# Patient Record
Sex: Male | Born: 1955 | Race: White | Hispanic: No | Marital: Single | Smoking: Current some day smoker
Health system: Southern US, Community
[De-identification: ages and names within clinical notes are randomized; demographics above are authoritative.]

## PROBLEM LIST (undated history)

## (undated) DIAGNOSIS — G4733 Obstructive sleep apnea (adult) (pediatric): Secondary | ICD-10-CM

## (undated) DIAGNOSIS — F259 Schizoaffective disorder, unspecified: Secondary | ICD-10-CM

## (undated) DIAGNOSIS — R451 Restlessness and agitation: Secondary | ICD-10-CM

## (undated) DIAGNOSIS — R079 Chest pain, unspecified: Secondary | ICD-10-CM

## (undated) DIAGNOSIS — I495 Sick sinus syndrome: Secondary | ICD-10-CM

## (undated) DIAGNOSIS — I200 Unstable angina: Secondary | ICD-10-CM

## (undated) DIAGNOSIS — Q600 Renal agenesis, unilateral: Secondary | ICD-10-CM

## (undated) DIAGNOSIS — Z45018 Encounter for adjustment and management of other part of cardiac pacemaker: Secondary | ICD-10-CM

## (undated) DIAGNOSIS — R0602 Shortness of breath: Secondary | ICD-10-CM

## (undated) DIAGNOSIS — I491 Atrial premature depolarization: Secondary | ICD-10-CM

## (undated) DIAGNOSIS — E039 Hypothyroidism, unspecified: Secondary | ICD-10-CM

## (undated) DIAGNOSIS — J45909 Unspecified asthma, uncomplicated: Secondary | ICD-10-CM

## (undated) DIAGNOSIS — J449 Chronic obstructive pulmonary disease, unspecified: Secondary | ICD-10-CM

## (undated) DIAGNOSIS — I209 Angina pectoris, unspecified: Secondary | ICD-10-CM

## (undated) DIAGNOSIS — E139 Other specified diabetes mellitus without complications: Secondary | ICD-10-CM

## (undated) DIAGNOSIS — I10 Essential (primary) hypertension: Secondary | ICD-10-CM

## (undated) DIAGNOSIS — E7800 Pure hypercholesterolemia, unspecified: Secondary | ICD-10-CM

## (undated) DIAGNOSIS — I2510 Atherosclerotic heart disease of native coronary artery without angina pectoris: Secondary | ICD-10-CM

## (undated) HISTORY — DX: Atrial premature depolarization: I49.1

## (undated) HISTORY — DX: Atherosclerotic heart disease of native coronary artery without angina pectoris: I25.10

## (undated) HISTORY — DX: Shortness of breath: R06.02

## (undated) HISTORY — DX: Sick sinus syndrome: I49.5

## (undated) HISTORY — DX: Unstable angina: I20.0

## (undated) HISTORY — DX: Encounter for adjustment and management of other part of cardiac pacemaker: Z45.018

## (undated) HISTORY — PX: CARDIAC PACEMAKER PLACEMENT: SHX583

## (undated) HISTORY — DX: Pure hypercholesterolemia, unspecified: E78.00

## (undated) HISTORY — PX: NEPHRECTOMY: SHX65

## (undated) HISTORY — DX: Essential (primary) hypertension: I10

## (undated) HISTORY — DX: Chest pain, unspecified: R07.9

## (undated) HISTORY — DX: Unspecified asthma, uncomplicated: J45.909

## (undated) HISTORY — DX: Other specified diabetes mellitus without complications: E13.9

## (undated) HISTORY — DX: Schizoaffective disorder, unspecified: F25.9

## (undated) HISTORY — DX: Restlessness and agitation: R45.1

## (undated) HISTORY — DX: Angina pectoris, unspecified: I20.9

## (undated) HISTORY — DX: Obstructive sleep apnea (adult) (pediatric): G47.33

## (undated) HISTORY — DX: Renal agenesis, unilateral: Q60.0

## (undated) HISTORY — DX: Hypothyroidism, unspecified: E03.9

## (undated) HISTORY — DX: Chronic obstructive pulmonary disease, unspecified: J44.9

---

## 2011-04-14 ENCOUNTER — Inpatient Hospital Stay: Payer: Self-pay | Admitting: Internal Medicine

## 2011-04-14 LAB — CBC WITH DIFFERENTIAL/PLATELET
Basophil #: 0 10*3/uL (ref 0.0–0.1)
Basophil %: 0.3 %
Eosinophil #: 0 10*3/uL (ref 0.0–0.7)
Eosinophil %: 1 %
HCT: 35.7 % — ABNORMAL LOW (ref 40.0–52.0)
HGB: 12.2 g/dL — ABNORMAL LOW (ref 13.0–18.0)
Lymphocyte #: 1 10*3/uL (ref 1.0–3.6)
MCH: 32.5 pg (ref 26.0–34.0)
MCV: 95 fL (ref 80–100)
Monocyte #: 0.4 10*3/uL (ref 0.0–0.7)
Neutrophil #: 2.3 10*3/uL (ref 1.4–6.5)
RBC: 3.74 10*6/uL — ABNORMAL LOW (ref 4.40–5.90)
RDW: 13.9 % (ref 11.5–14.5)
WBC: 3.8 10*3/uL (ref 3.8–10.6)

## 2011-04-14 LAB — CK TOTAL AND CKMB (NOT AT ARMC)
CK, Total: 167 U/L (ref 35–232)
CK-MB: 2.8 ng/mL (ref 0.5–3.6)
CK-MB: 2.6 ng/mL (ref 0.5–3.6)

## 2011-04-14 LAB — COMPREHENSIVE METABOLIC PANEL
Albumin: 3.7 g/dL (ref 3.4–5.0)
Alkaline Phosphatase: 56 U/L (ref 50–136)
Anion Gap: 12 (ref 7–16)
BUN: 19 mg/dL — ABNORMAL HIGH (ref 7–18)
Bilirubin,Total: 0.4 mg/dL (ref 0.2–1.0)
Calcium, Total: 8.5 mg/dL (ref 8.5–10.1)
Creatinine: 1.5 mg/dL — ABNORMAL HIGH (ref 0.60–1.30)
Glucose: 96 mg/dL (ref 65–99)
Osmolality: 289 (ref 275–301)
SGPT (ALT): 30 U/L
Sodium: 144 mmol/L (ref 136–145)
Total Protein: 7.4 g/dL (ref 6.4–8.2)

## 2011-04-14 LAB — PROTIME-INR
INR: 1
Prothrombin Time: 13.7 secs (ref 11.5–14.7)

## 2011-04-14 LAB — HEMOGLOBIN A1C: Hemoglobin A1C: 6 % (ref 4.2–6.3)

## 2011-04-14 LAB — APTT: Activated PTT: 64 secs — ABNORMAL HIGH (ref 23.6–35.9)

## 2011-04-14 LAB — TROPONIN I: Troponin-I: 0.08 ng/mL — ABNORMAL HIGH

## 2011-04-15 LAB — BASIC METABOLIC PANEL
BUN: 18 mg/dL (ref 7–18)
Chloride: 109 mmol/L — ABNORMAL HIGH (ref 98–107)
Creatinine: 1.61 mg/dL — ABNORMAL HIGH (ref 0.60–1.30)
EGFR (African American): 58 — ABNORMAL LOW
EGFR (Non-African Amer.): 48 — ABNORMAL LOW
Glucose: 134 mg/dL — ABNORMAL HIGH (ref 65–99)
Osmolality: 298 (ref 275–301)
Potassium: 3.8 mmol/L (ref 3.5–5.1)

## 2011-04-15 LAB — CBC WITH DIFFERENTIAL/PLATELET
Eosinophil %: 1.5 %
HCT: 34.3 % — ABNORMAL LOW (ref 40.0–52.0)
Lymphocyte #: 1.3 10*3/uL (ref 1.0–3.6)
MCHC: 34.2 g/dL (ref 32.0–36.0)
MCV: 95 fL (ref 80–100)
Monocyte %: 10.4 %
Neutrophil #: 2.2 10*3/uL (ref 1.4–6.5)
Platelet: 105 10*3/uL — ABNORMAL LOW (ref 150–440)
RDW: 13.7 % (ref 11.5–14.5)
WBC: 3.9 10*3/uL (ref 3.8–10.6)

## 2011-04-15 LAB — APTT: Activated PTT: 106.3 s — ABNORMAL HIGH

## 2011-04-15 LAB — LIPID PANEL
Cholesterol: 137 mg/dL
HDL Cholesterol: 20 mg/dL — ABNORMAL LOW
Ldl Cholesterol, Calc: 75 mg/dL
Triglycerides: 208 mg/dL — ABNORMAL HIGH
VLDL Cholesterol, Calc: 42 mg/dL — ABNORMAL HIGH

## 2011-04-16 LAB — BASIC METABOLIC PANEL
BUN: 16 mg/dL (ref 7–18)
Calcium, Total: 8.8 mg/dL (ref 8.5–10.1)
Creatinine: 1.53 mg/dL — ABNORMAL HIGH (ref 0.60–1.30)
EGFR (African American): >60
EGFR (Non-African Amer.): 51 — ABNORMAL LOW
Potassium: 3.5 mmol/L (ref 3.5–5.1)
Sodium: 146 mmol/L — ABNORMAL HIGH (ref 136–145)

## 2011-04-16 LAB — CBC WITH DIFFERENTIAL/PLATELET
Basophil #: 0 10*3/uL (ref 0.0–0.1)
Basophil %: 0.2 %
Eosinophil %: 1.5 %
HCT: 40.7 % (ref 40.0–52.0)
HGB: 13.6 g/dL (ref 13.0–18.0)
Lymphocyte #: 0.8 10*3/uL — ABNORMAL LOW (ref 1.0–3.6)
Lymphocyte %: 14.2 %
MCH: 31.6 pg (ref 26.0–34.0)
MCV: 95 fL (ref 80–100)
RDW: 13.2 % (ref 11.5–14.5)

## 2011-06-10 ENCOUNTER — Observation Stay: Payer: Self-pay | Admitting: Internal Medicine

## 2011-06-10 LAB — COMPREHENSIVE METABOLIC PANEL
Albumin: 3.3 g/dL — ABNORMAL LOW (ref 3.4–5.0)
Anion Gap: 11 (ref 7–16)
BUN: 15 mg/dL (ref 7–18)
Bilirubin,Total: 0.4 mg/dL (ref 0.2–1.0)
Calcium, Total: 8.1 mg/dL — ABNORMAL LOW (ref 8.5–10.1)
Co2: 25 mmol/L (ref 21–32)
Creatinine: 1.56 mg/dL — ABNORMAL HIGH (ref 0.60–1.30)
EGFR (African American): 57 — ABNORMAL LOW
EGFR (Non-African Amer.): 49 — ABNORMAL LOW
Glucose: 108 mg/dL — ABNORMAL HIGH (ref 65–99)
Potassium: 3.3 mmol/L — ABNORMAL LOW (ref 3.5–5.1)
SGOT(AST): 17 U/L (ref 15–37)
SGPT (ALT): 25 U/L
Sodium: 144 mmol/L (ref 136–145)
Total Protein: 6.7 g/dL (ref 6.4–8.2)

## 2011-06-10 LAB — PROTIME-INR: INR: 1

## 2011-06-10 LAB — CBC
HGB: 11.3 g/dL — ABNORMAL LOW (ref 13.0–18.0)
MCH: 31.9 pg (ref 26.0–34.0)
MCV: 94 fL (ref 80–100)
Platelet: 129 10*3/uL — ABNORMAL LOW (ref 150–440)
RDW: 13.7 % (ref 11.5–14.5)

## 2011-06-10 LAB — CK TOTAL AND CKMB (NOT AT ARMC): CK-MB: 4.5 ng/mL — ABNORMAL HIGH (ref 0.5–3.6)

## 2011-06-10 LAB — APTT
Activated PTT: 27.9 secs (ref 23.6–35.9)
Activated PTT: 113.6 secs — ABNORMAL HIGH (ref 23.6–35.9)
Activated PTT: 52.2 secs — ABNORMAL HIGH (ref 23.6–35.9)

## 2011-06-10 LAB — TROPONIN I
Troponin-I: 0.09 ng/mL — ABNORMAL HIGH
Troponin-I: 0.09 ng/mL — ABNORMAL HIGH
Troponin-I: 0.09 ng/mL — ABNORMAL HIGH

## 2011-06-11 LAB — PLATELET COUNT: Platelet: 119 10*3/uL — ABNORMAL LOW (ref 150–440)

## 2011-06-11 LAB — APTT: Activated PTT: 102.4 secs — ABNORMAL HIGH (ref 23.6–35.9)

## 2011-07-14 LAB — CBC
HGB: 11.2 g/dL — ABNORMAL LOW (ref 13.0–18.0)
MCH: 31.1 pg (ref 26.0–34.0)
MCHC: 33.2 g/dL (ref 32.0–36.0)
RBC: 3.59 10*6/uL — ABNORMAL LOW (ref 4.40–5.90)

## 2011-07-14 LAB — PROTIME-INR
INR: 0.9
Prothrombin Time: 13 secs (ref 11.5–14.7)

## 2011-07-14 LAB — COMPREHENSIVE METABOLIC PANEL
BUN: 16 mg/dL (ref 7–18)
Bilirubin,Total: 0.4 mg/dL (ref 0.2–1.0)
Co2: 25 mmol/L (ref 21–32)
Glucose: 94 mg/dL (ref 65–99)
Potassium: 3.8 mmol/L (ref 3.5–5.1)
SGOT(AST): 21 U/L (ref 15–37)
Total Protein: 6.6 g/dL (ref 6.4–8.2)

## 2011-07-14 LAB — CK TOTAL AND CKMB (NOT AT ARMC)
CK, Total: 175 U/L (ref 35–232)
CK-MB: 2.8 ng/mL (ref 0.5–3.6)

## 2011-07-14 LAB — TROPONIN I
Troponin-I: 0.08 ng/mL — ABNORMAL HIGH
Troponin-I: 0.09 ng/mL — ABNORMAL HIGH

## 2011-07-15 ENCOUNTER — Observation Stay: Payer: Self-pay | Admitting: Specialist

## 2011-07-15 LAB — URINALYSIS, COMPLETE
Blood: NEGATIVE
Glucose,UR: NEGATIVE mg/dL (ref 0–75)
Protein: NEGATIVE
Squamous Epithelial: NONE SEEN

## 2011-07-15 LAB — TROPONIN I: Troponin-I: 0.11 ng/mL — ABNORMAL HIGH

## 2011-07-16 LAB — LIPID PANEL
Cholesterol: 154 mg/dL (ref 0–200)
HDL Cholesterol: 25 mg/dL — ABNORMAL LOW (ref 40–60)
Ldl Cholesterol, Calc: 103 mg/dL — ABNORMAL HIGH (ref 0–100)
Triglycerides: 128 mg/dL (ref 0–200)

## 2011-07-16 LAB — BASIC METABOLIC PANEL
Anion Gap: 7 (ref 7–16)
BUN: 13 mg/dL (ref 7–18)
Calcium, Total: 8.8 mg/dL (ref 8.5–10.1)
Chloride: 109 mmol/L — ABNORMAL HIGH (ref 98–107)
Co2: 28 mmol/L (ref 21–32)
Creatinine: 1.35 mg/dL — ABNORMAL HIGH (ref 0.60–1.30)
EGFR (African American): >60
Glucose: 77 mg/dL (ref 65–99)
Osmolality: 286 (ref 275–301)
Sodium: 144 mmol/L (ref 136–145)

## 2011-07-16 LAB — MAGNESIUM: Magnesium: 1.8 mg/dL

## 2011-07-16 LAB — CBC WITH DIFFERENTIAL/PLATELET
Lymphocyte #: 0.6 10*3/uL — ABNORMAL LOW (ref 1.0–3.6)
Lymphocyte %: 23.4 %
MCH: 30.1 pg (ref 26.0–34.0)
MCHC: 32.7 g/dL (ref 32.0–36.0)
MCV: 92 fL (ref 80–100)
Monocyte #: 0.4 x10 3/mm (ref 0.2–1.0)
Neutrophil #: 1.7 10*3/uL (ref 1.4–6.5)
RDW: 14.3 % (ref 11.5–14.5)

## 2011-11-04 ENCOUNTER — Emergency Department: Payer: Self-pay | Admitting: Emergency Medicine

## 2011-11-04 LAB — COMPREHENSIVE METABOLIC PANEL
Albumin: 3.6 g/dL (ref 3.4–5.0)
Anion Gap: 7 (ref 7–16)
BUN: 18 mg/dL (ref 7–18)
Bilirubin,Total: 0.3 mg/dL (ref 0.2–1.0)
Creatinine: 1.44 mg/dL — ABNORMAL HIGH (ref 0.60–1.30)
Glucose: 89 mg/dL (ref 65–99)
Osmolality: 288 (ref 275–301)
Potassium: 4.2 mmol/L (ref 3.5–5.1)
Sodium: 144 mmol/L (ref 136–145)
Total Protein: 7.1 g/dL (ref 6.4–8.2)

## 2011-11-04 LAB — CBC
HGB: 11.9 g/dL — ABNORMAL LOW (ref 13.0–18.0)
MCV: 92 fL (ref 80–100)
Platelet: 132 10*3/uL — ABNORMAL LOW (ref 150–440)
RDW: 14.6 % — ABNORMAL HIGH (ref 11.5–14.5)

## 2011-11-04 LAB — LIPASE, BLOOD: Lipase: 227 U/L (ref 73–393)

## 2012-02-09 ENCOUNTER — Emergency Department: Payer: Self-pay | Admitting: Emergency Medicine

## 2012-02-09 LAB — CK TOTAL AND CKMB (NOT AT ARMC)
CK, Total: 77 U/L (ref 35–232)
CK-MB: 1.6 ng/mL (ref 0.5–3.6)

## 2012-02-09 LAB — TROPONIN I
Troponin-I: 0.07 ng/mL — ABNORMAL HIGH
Troponin-I: 0.08 ng/mL — ABNORMAL HIGH

## 2012-02-09 LAB — BASIC METABOLIC PANEL
BUN: 23 mg/dL — ABNORMAL HIGH (ref 7–18)
Creatinine: 1.46 mg/dL — ABNORMAL HIGH (ref 0.60–1.30)
EGFR (African American): >60

## 2012-02-09 LAB — CBC
HCT: 33.1 % — ABNORMAL LOW (ref 40.0–52.0)
MCH: 30.3 pg (ref 26.0–34.0)
MCHC: 33 g/dL (ref 32.0–36.0)
MCV: 92 fL (ref 80–100)
RBC: 3.6 10*6/uL — ABNORMAL LOW (ref 4.40–5.90)
RDW: 14.2 % (ref 11.5–14.5)

## 2012-03-08 ENCOUNTER — Emergency Department: Payer: Self-pay | Admitting: Emergency Medicine

## 2012-03-08 LAB — BASIC METABOLIC PANEL
BUN: 24 mg/dL — ABNORMAL HIGH (ref 7–18)
Calcium, Total: 8.8 mg/dL (ref 8.5–10.1)
EGFR (Non-African Amer.): 59 — ABNORMAL LOW
Glucose: 82 mg/dL (ref 65–99)
Osmolality: 283 (ref 275–301)
Potassium: 3.5 mmol/L (ref 3.5–5.1)

## 2012-03-08 LAB — CBC
HCT: 36 % — ABNORMAL LOW (ref 40.0–52.0)
HGB: 11.9 g/dL — ABNORMAL LOW (ref 13.0–18.0)
MCHC: 33.1 g/dL (ref 32.0–36.0)
MCV: 92 fL (ref 80–100)
WBC: 9.3 10*3/uL (ref 3.8–10.6)

## 2012-03-08 LAB — CK TOTAL AND CKMB (NOT AT ARMC)
CK, Total: 68 U/L (ref 35–232)
CK, Total: 51 U/L (ref 35–232)
CK-MB: <0.5 ng/mL — ABNORMAL LOW (ref 0.5–3.6)
CK-MB: <0.5 ng/mL — ABNORMAL LOW (ref 0.5–3.6)

## 2012-03-08 LAB — TROPONIN I: Troponin-I: 0.07 ng/mL — ABNORMAL HIGH

## 2012-06-15 ENCOUNTER — Emergency Department: Payer: Self-pay | Admitting: Emergency Medicine

## 2012-06-15 LAB — BASIC METABOLIC PANEL
BUN: 20 mg/dL — ABNORMAL HIGH (ref 7–18)
Calcium, Total: 9.1 mg/dL (ref 8.5–10.1)
Co2: 30 mmol/L (ref 21–32)
Creatinine: 1.43 mg/dL — ABNORMAL HIGH (ref 0.60–1.30)
Osmolality: 281 (ref 275–301)
Potassium: 3.8 mmol/L (ref 3.5–5.1)
Sodium: 140 mmol/L (ref 136–145)

## 2012-06-15 LAB — CBC
HCT: 34.2 % — ABNORMAL LOW (ref 40.0–52.0)
HGB: 11.7 g/dL — ABNORMAL LOW (ref 13.0–18.0)
MCH: 31.3 pg (ref 26.0–34.0)
MCHC: 34.2 g/dL (ref 32.0–36.0)
MCV: 92 fL (ref 80–100)
Platelet: 198 10*3/uL (ref 150–440)
RDW: 13.5 % (ref 11.5–14.5)

## 2012-06-15 LAB — CK TOTAL AND CKMB (NOT AT ARMC): CK, Total: 111 U/L (ref 35–232)

## 2012-06-15 LAB — TROPONIN I: Troponin-I: 0.07 ng/mL — ABNORMAL HIGH

## 2012-06-16 ENCOUNTER — Observation Stay: Payer: Self-pay | Admitting: Internal Medicine

## 2012-06-16 LAB — BASIC METABOLIC PANEL
Anion Gap: 6 — ABNORMAL LOW (ref 7–16)
Calcium, Total: 8 mg/dL — ABNORMAL LOW (ref 8.5–10.1)
Chloride: 109 mmol/L — ABNORMAL HIGH (ref 98–107)
Co2: 26 mmol/L (ref 21–32)
EGFR (African American): >60
EGFR (Non-African Amer.): 56 — ABNORMAL LOW
Sodium: 141 mmol/L (ref 136–145)

## 2012-06-16 LAB — CK TOTAL AND CKMB (NOT AT ARMC): CK, Total: 86 U/L (ref 35–232)

## 2012-06-16 LAB — TROPONIN I
Troponin-I: 0.06 ng/mL — ABNORMAL HIGH
Troponin-I: 0.07 ng/mL — ABNORMAL HIGH

## 2012-06-17 LAB — LIPID PANEL
Cholesterol: 166 mg/dL (ref 0–200)
HDL Cholesterol: 32 mg/dL — ABNORMAL LOW (ref 40–60)
Ldl Cholesterol, Calc: 112 mg/dL — ABNORMAL HIGH (ref 0–100)
VLDL Cholesterol, Calc: 22 mg/dL (ref 5–40)

## 2012-06-17 LAB — CBC WITH DIFFERENTIAL/PLATELET
Basophil #: 0 10*3/uL (ref 0.0–0.1)
Basophil %: 0.2 %
Eosinophil %: 0.4 %
HCT: 36.5 % — ABNORMAL LOW (ref 40.0–52.0)
Lymphocyte #: 0.8 10*3/uL — ABNORMAL LOW (ref 1.0–3.6)
MCH: 31.5 pg (ref 26.0–34.0)
MCHC: 34.5 g/dL (ref 32.0–36.0)
MCV: 91 fL (ref 80–100)
Monocyte %: 4.9 %
Neutrophil %: 77.1 %
RBC: 4 10*6/uL — ABNORMAL LOW (ref 4.40–5.90)
RDW: 13.5 % (ref 11.5–14.5)
WBC: 4.7 10*3/uL (ref 3.8–10.6)

## 2012-06-17 LAB — HEMOGLOBIN A1C: Hemoglobin A1C: 5.1 % (ref 4.2–6.3)

## 2012-06-17 LAB — BASIC METABOLIC PANEL
Anion Gap: 7 (ref 7–16)
Calcium, Total: 8.8 mg/dL (ref 8.5–10.1)
Chloride: 109 mmol/L — ABNORMAL HIGH (ref 98–107)
Co2: 28 mmol/L (ref 21–32)
Creatinine: 1.43 mg/dL — ABNORMAL HIGH (ref 0.60–1.30)
EGFR (Non-African Amer.): 54 — ABNORMAL LOW
Osmolality: 289 (ref 275–301)
Potassium: 4.3 mmol/L (ref 3.5–5.1)
Sodium: 144 mmol/L (ref 136–145)

## 2012-06-17 LAB — TROPONIN I: Troponin-I: 0.07 ng/mL — ABNORMAL HIGH

## 2012-06-17 LAB — CK TOTAL AND CKMB (NOT AT ARMC): CK-MB: 1.6 ng/mL (ref 0.5–3.6)

## 2012-06-17 LAB — T4, FREE: Free Thyroxine: 1.03 ng/dL (ref 0.76–1.46)

## 2012-06-17 LAB — MAGNESIUM: Magnesium: 1.8 mg/dL

## 2012-07-28 ENCOUNTER — Emergency Department: Payer: Self-pay | Admitting: Emergency Medicine

## 2012-07-29 LAB — COMPREHENSIVE METABOLIC PANEL
BUN: 19 mg/dL — ABNORMAL HIGH (ref 7–18)
Bilirubin,Total: 0.3 mg/dL (ref 0.2–1.0)
Calcium, Total: 8.4 mg/dL — ABNORMAL LOW (ref 8.5–10.1)
Chloride: 107 mmol/L (ref 98–107)
Co2: 28 mmol/L (ref 21–32)
Creatinine: 1.45 mg/dL — ABNORMAL HIGH (ref 0.60–1.30)
EGFR (African American): >60
Glucose: 111 mg/dL — ABNORMAL HIGH (ref 65–99)
Osmolality: 282 (ref 275–301)
Total Protein: 6.3 g/dL — ABNORMAL LOW (ref 6.4–8.2)

## 2012-07-29 LAB — CBC
HGB: 11.1 g/dL — ABNORMAL LOW (ref 13.0–18.0)
MCHC: 33.6 g/dL (ref 32.0–36.0)
MCV: 95 fL (ref 80–100)
RBC: 3.47 10*6/uL — ABNORMAL LOW (ref 4.40–5.90)
WBC: 3.6 10*3/uL — ABNORMAL LOW (ref 3.8–10.6)

## 2012-07-29 LAB — TROPONIN I
Troponin-I: 0.07 ng/mL — ABNORMAL HIGH
Troponin-I: 0.08 ng/mL — ABNORMAL HIGH

## 2012-07-29 LAB — CK TOTAL AND CKMB (NOT AT ARMC): CK, Total: 138 U/L (ref 35–232)

## 2012-08-27 ENCOUNTER — Emergency Department: Payer: Self-pay | Admitting: Emergency Medicine

## 2012-08-27 LAB — COMPREHENSIVE METABOLIC PANEL
Albumin: 3.4 g/dL (ref 3.4–5.0)
Anion Gap: 2 — ABNORMAL LOW (ref 7–16)
Bilirubin,Total: 0.3 mg/dL (ref 0.2–1.0)
Co2: 31 mmol/L (ref 21–32)
Creatinine: 1.49 mg/dL — ABNORMAL HIGH (ref 0.60–1.30)
EGFR (African American): 60 — ABNORMAL LOW
EGFR (Non-African Amer.): 52 — ABNORMAL LOW
Glucose: 111 mg/dL — ABNORMAL HIGH (ref 65–99)
Osmolality: 277 (ref 275–301)
SGPT (ALT): 20 U/L (ref 12–78)
Sodium: 137 mmol/L (ref 136–145)

## 2012-08-27 LAB — CBC
HCT: 34.4 % — ABNORMAL LOW (ref 40.0–52.0)
HGB: 11.8 g/dL — ABNORMAL LOW (ref 13.0–18.0)
MCH: 32.1 pg (ref 26.0–34.0)
MCHC: 34.4 g/dL (ref 32.0–36.0)
Platelet: 143 10*3/uL — ABNORMAL LOW (ref 150–440)
WBC: 4.3 10*3/uL (ref 3.8–10.6)

## 2012-08-27 LAB — TROPONIN I: Troponin-I: 0.1 ng/mL — ABNORMAL HIGH

## 2012-08-27 LAB — CK TOTAL AND CKMB (NOT AT ARMC): CK-MB: 3.3 ng/mL (ref 0.5–3.6)

## 2012-08-28 LAB — URINALYSIS, COMPLETE
Bacteria: NONE SEEN
Blood: NEGATIVE
Glucose,UR: NEGATIVE mg/dL (ref 0–75)
Leukocyte Esterase: NEGATIVE
Nitrite: NEGATIVE
Ph: 7 (ref 4.5–8.0)
RBC,UR: <1 /HPF (ref 0–5)
Specific Gravity: 1.002 (ref 1.003–1.030)
WBC UR: 2 /HPF (ref 0–5)

## 2012-08-28 LAB — DRUG SCREEN, URINE
Amphetamines, Ur Screen: NEGATIVE (ref ?–1000)
Barbiturates, Ur Screen: NEGATIVE (ref ?–200)
Benzodiazepine, Ur Scrn: NEGATIVE (ref ?–200)
Cannabinoid 50 Ng, Ur ~~LOC~~: NEGATIVE (ref ?–50)
Cocaine Metabolite,Ur ~~LOC~~: NEGATIVE (ref ?–300)
MDMA (Ecstasy)Ur Screen: NEGATIVE (ref ?–500)
Opiate, Ur Screen: NEGATIVE (ref ?–300)
Phencyclidine (PCP) Ur S: NEGATIVE (ref ?–25)
Tricyclic, Ur Screen: NEGATIVE (ref ?–1000)

## 2012-08-28 LAB — TROPONIN I: Troponin-I: 0.12 ng/mL — ABNORMAL HIGH

## 2012-10-31 IMAGING — CR DG CHEST 1V PORT
1 series · 1 of 1 positions shown · non-contrast
Comparison: none

REASON FOR EXAM: Chest Pain
COMMENTS:

PROCEDURE:     DXR - DXR PORTABLE CHEST SINGLE VIEW  - July 14, 2011  [DATE]
RESULT:     Comparison is made to the previous exam of 10 June, 2011.
The cardiac silhouette is enlarged. Left-sided pacemaker device with 2 leads
is present. There is no infiltrate, edema, effusion or pneumothorax.

[ap]
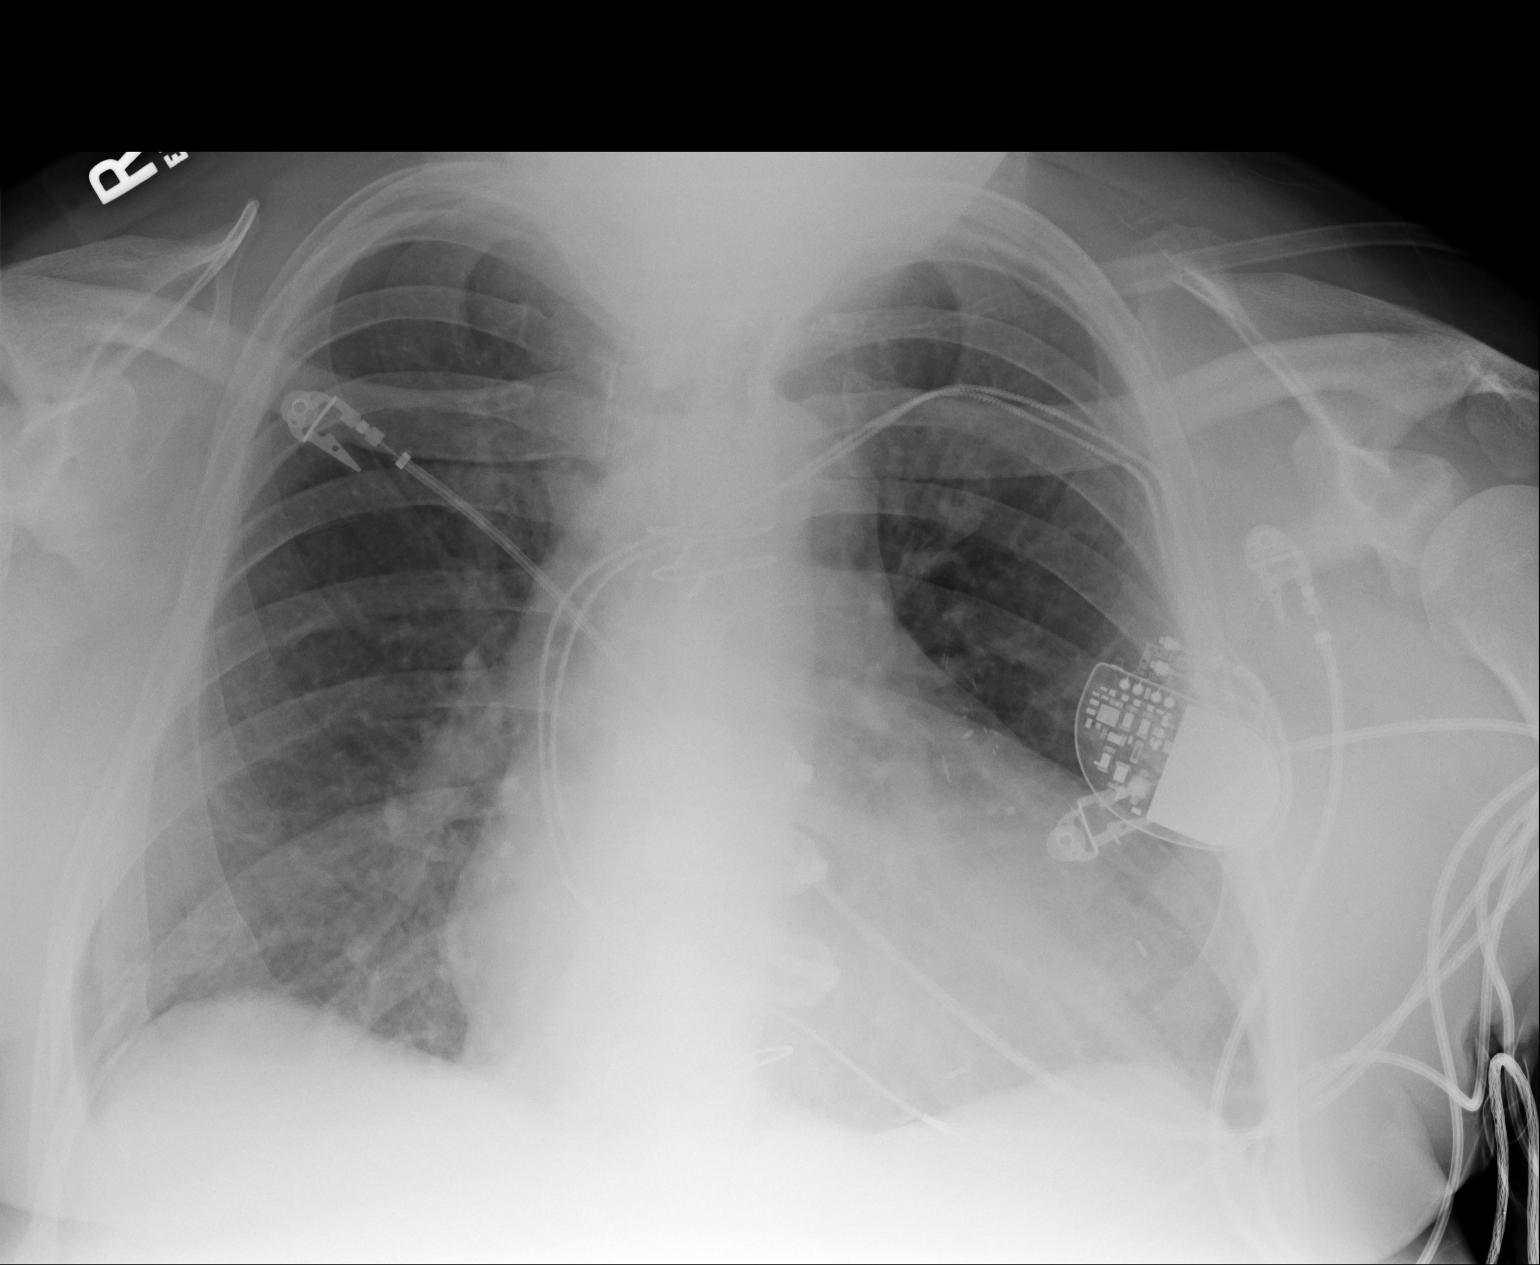

[1 of 1 positions shown; findings below may reference images not displayed]

IMPRESSION: 1. Cardiomegaly, stable. Pacemaker present.

[REDACTED]

## 2012-10-31 IMAGING — CT CT CHEST W/ CM
1 series · 16 of 32 positions shown, 20 images · IV contrast (APPLIED)
Comparison: none

REASON FOR EXAM: pleuritic chest pain
COMMENTS:

[Series 4: soft tissue · axial · 0.86mm/px · z∈[-808,-504]mm · 16 of 111 slices shown, 20 images]
[im 5/111  mediastinal]
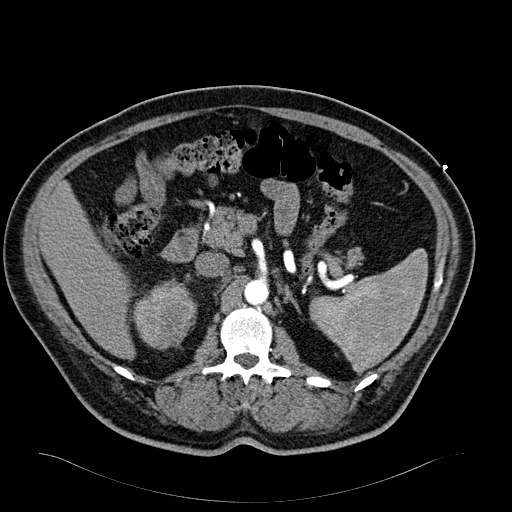
[im 5/111  lung]
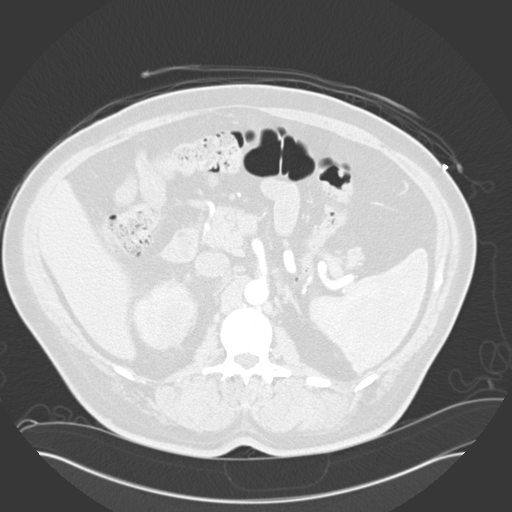
[im 13/111  lung]
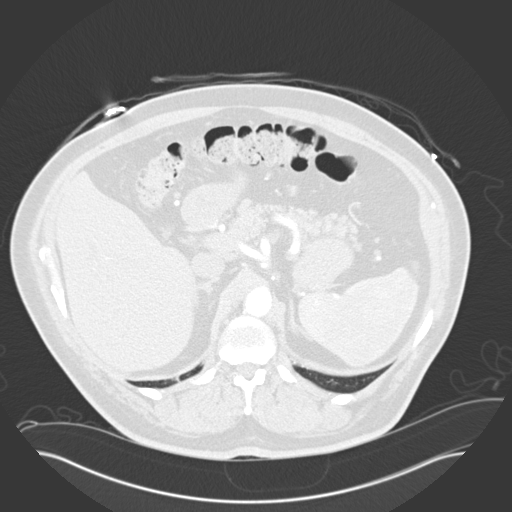
[im 21/111  lung]
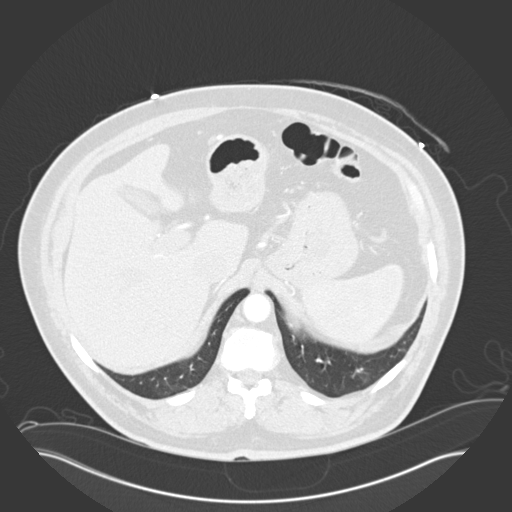
[im 25/111  lung]
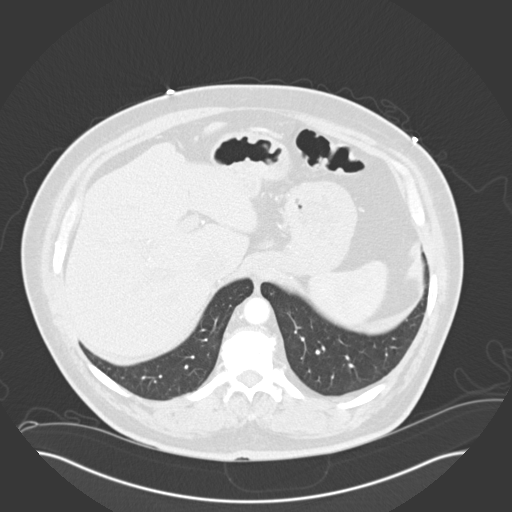
[im 33/111  mediastinal]
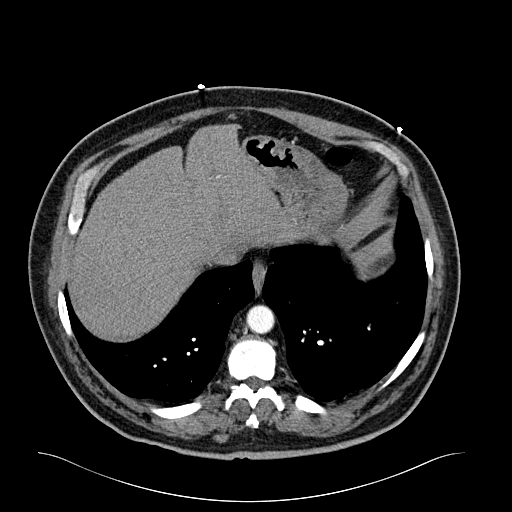
[im 33/111  lung]
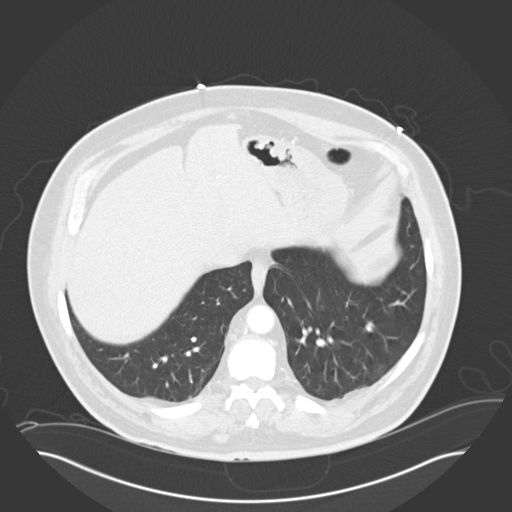
[im 41/111  lung]
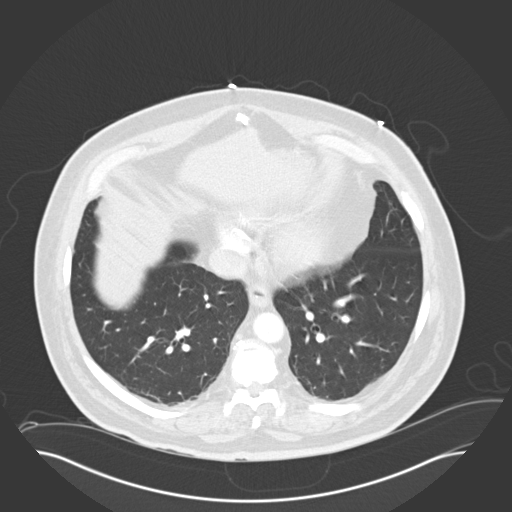
[im 49/111  lung]
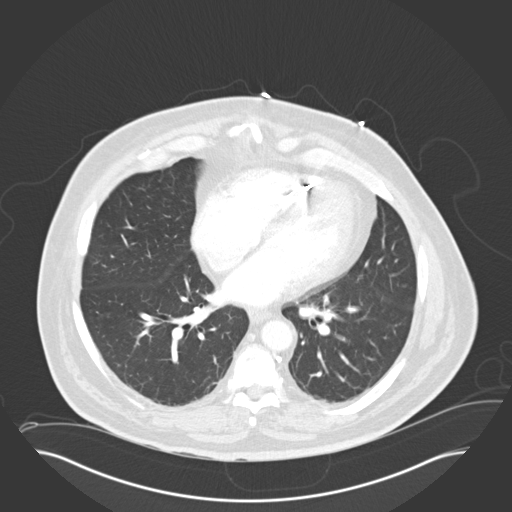
[im 58/111  lung]
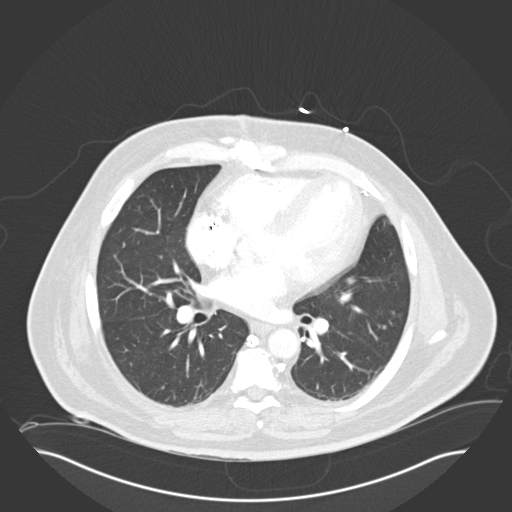
[im 59/111  mediastinal]
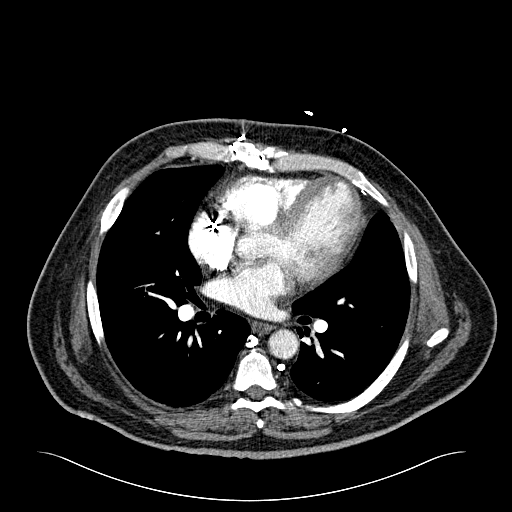
[im 59/111  lung]
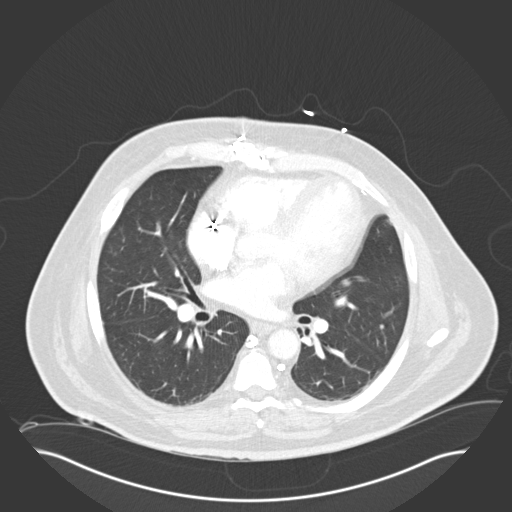
[im 66/111  lung]
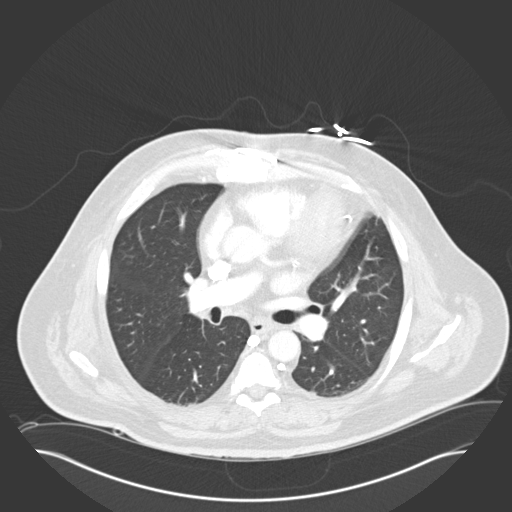
[im 70/111  lung]
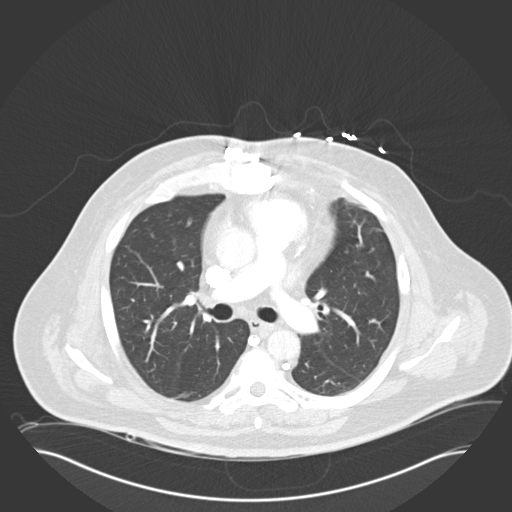
[im 78/111  lung]
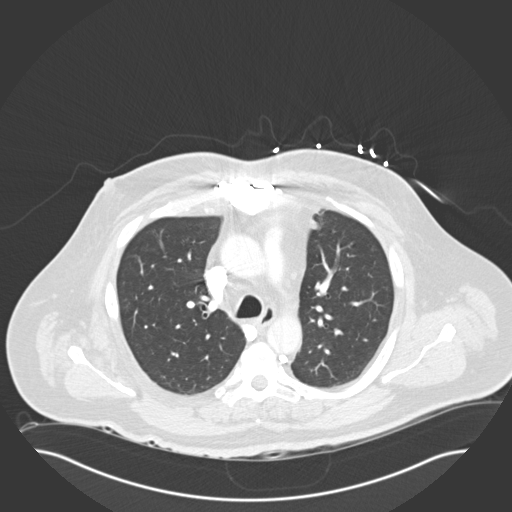
[im 86/111  mediastinal]
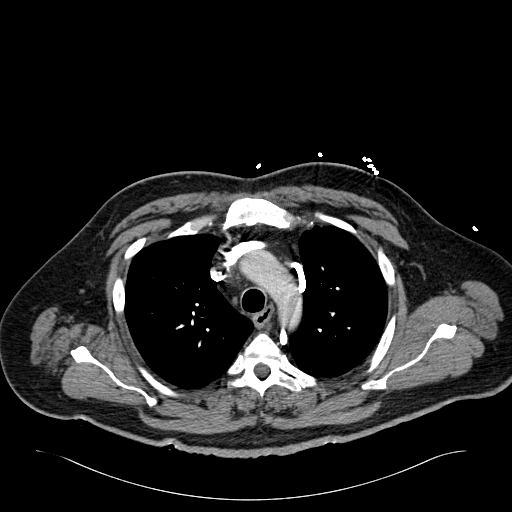
[im 86/111  lung]
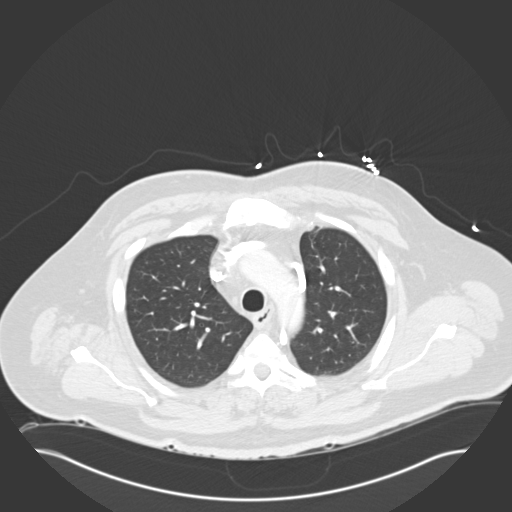
[im 90/111  lung]
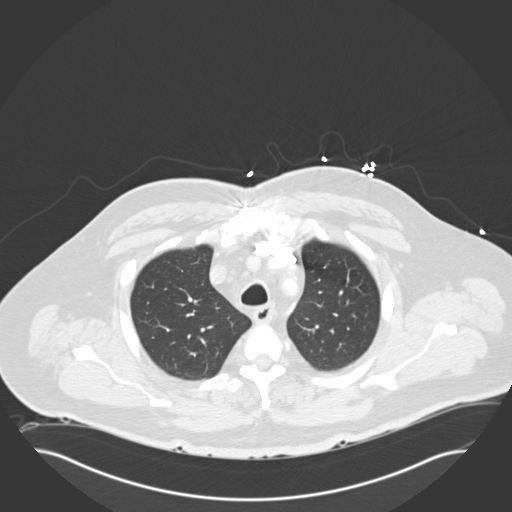
[im 98/111  lung]
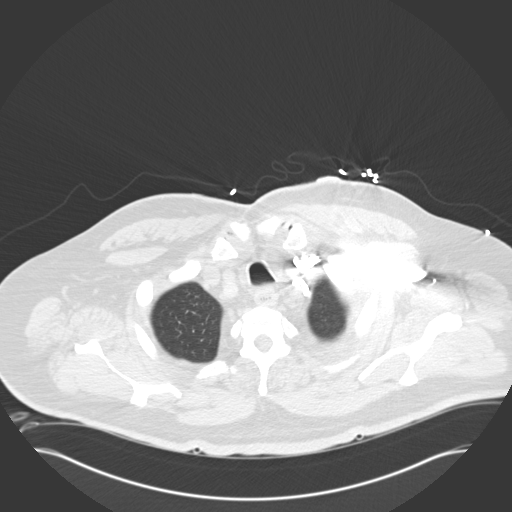
[im 106/111  lung]
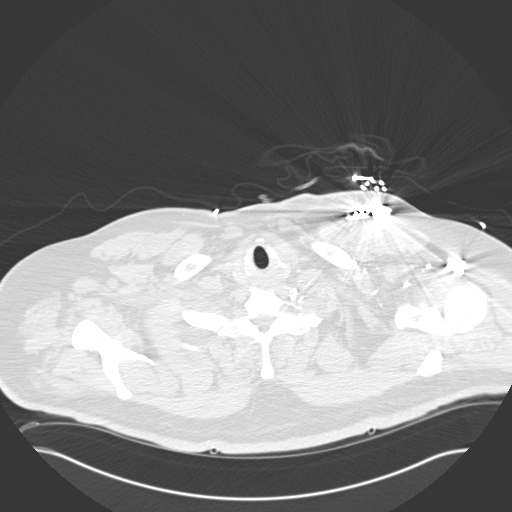

[16 of 32 positions shown; findings below may reference images not displayed]

PROCEDURE:     CT  - CT CHEST (FOR PE) W  - July 14, 2011 [DATE]

RESULT:     Chest CT is performed utilizing 100 mL of [id number] iodinated
intravenous contrast with images reconstructed in the axial plane at 3.0 mm
slice thickness. There is no previous similar study for comparison.

The thoracic aorta is normal in caliber without dissection. The pulmonary
arteries opacified without filling defect to suggest pulmonary embolism.
There is no pleural or pericardial effusion. The included upper abdominal
structures appear unremarkable. The gallbladder is nondistended. The distal
esophagus appears normal. The heart is mildly enlarged. Lung window images
do not show significant edema or evidence of a focal infiltrate. There is no
pneumothorax. There is no focal mass. There is no mediastinal or hilar mass
or adenopathy.
IMPRESSION: 1. Mild cardiomegaly.
2. No pulmonary embolism evident.

[REDACTED]

## 2012-12-02 ENCOUNTER — Emergency Department: Payer: Self-pay | Admitting: Emergency Medicine

## 2012-12-02 LAB — CBC
HGB: 12.7 g/dL — ABNORMAL LOW (ref 13.0–18.0)
MCH: 32.4 pg (ref 26.0–34.0)
MCHC: 34.2 g/dL (ref 32.0–36.0)
MCV: 95 fL (ref 80–100)
Platelet: 155 10*3/uL (ref 150–440)
RBC: 3.91 10*6/uL — ABNORMAL LOW (ref 4.40–5.90)
RDW: 13.7 % (ref 11.5–14.5)
WBC: 8.9 10*3/uL (ref 3.8–10.6)

## 2012-12-02 LAB — COMPREHENSIVE METABOLIC PANEL
Albumin: 3.4 g/dL (ref 3.4–5.0)
Alkaline Phosphatase: 62 U/L (ref 50–136)
BUN: 26 mg/dL — ABNORMAL HIGH (ref 7–18)
Bilirubin,Total: 0.7 mg/dL (ref 0.2–1.0)
Chloride: 111 mmol/L — ABNORMAL HIGH (ref 98–107)
Co2: 29 mmol/L (ref 21–32)
Creatinine: 1.59 mg/dL — ABNORMAL HIGH (ref 0.60–1.30)
EGFR (Non-African Amer.): 48 — ABNORMAL LOW
Osmolality: 291 (ref 275–301)
Potassium: 3.9 mmol/L (ref 3.5–5.1)
SGPT (ALT): 18 U/L (ref 12–78)
Sodium: 143 mmol/L (ref 136–145)

## 2012-12-09 ENCOUNTER — Observation Stay: Payer: Self-pay | Admitting: Internal Medicine

## 2012-12-09 LAB — TROPONIN I
Troponin-I: 0.09 ng/mL — ABNORMAL HIGH
Troponin-I: 0.1 ng/mL — ABNORMAL HIGH

## 2012-12-09 LAB — COMPREHENSIVE METABOLIC PANEL
Anion Gap: 5 — ABNORMAL LOW (ref 7–16)
BUN: 27 mg/dL — ABNORMAL HIGH (ref 7–18)
Bilirubin,Total: 0.3 mg/dL (ref 0.2–1.0)
Co2: 27 mmol/L (ref 21–32)
Creatinine: 1.43 mg/dL — ABNORMAL HIGH (ref 0.60–1.30)
EGFR (African American): >60
EGFR (Non-African Amer.): 54 — ABNORMAL LOW
Osmolality: 290 (ref 275–301)
Potassium: 3.8 mmol/L (ref 3.5–5.1)
SGOT(AST): 14 U/L — ABNORMAL LOW (ref 15–37)
Sodium: 143 mmol/L (ref 136–145)

## 2012-12-09 LAB — CBC
MCHC: 34.3 g/dL (ref 32.0–36.0)
Platelet: 143 10*3/uL — ABNORMAL LOW (ref 150–440)
RDW: 13.1 % (ref 11.5–14.5)

## 2012-12-10 LAB — BASIC METABOLIC PANEL
Anion Gap: 1 — ABNORMAL LOW (ref 7–16)
BUN: 23 mg/dL — ABNORMAL HIGH (ref 7–18)
Calcium, Total: 8.4 mg/dL — ABNORMAL LOW (ref 8.5–10.1)
Calcium, Total: 8.6 mg/dL (ref 8.5–10.1)
Chloride: 110 mmol/L — ABNORMAL HIGH (ref 98–107)
Chloride: 104 mmol/L (ref 98–107)
Co2: 29 mmol/L (ref 21–32)
Creatinine: 1.33 mg/dL — ABNORMAL HIGH (ref 0.60–1.30)
Creatinine: 1.39 mg/dL — ABNORMAL HIGH (ref 0.60–1.30)
EGFR (African American): >60
EGFR (Non-African Amer.): 56 — ABNORMAL LOW
Glucose: 119 mg/dL — ABNORMAL HIGH (ref 65–99)
Glucose: 82 mg/dL (ref 65–99)
Osmolality: 295 (ref 275–301)
Potassium: 4 mmol/L (ref 3.5–5.1)
Potassium: 3.8 mmol/L (ref 3.5–5.1)

## 2012-12-10 LAB — LIPID PANEL
Cholesterol: 129 mg/dL (ref 0–200)
HDL Cholesterol: 31 mg/dL — ABNORMAL LOW (ref 40–60)
Ldl Cholesterol, Calc: 67 mg/dL (ref 0–100)
Triglycerides: 155 mg/dL (ref 0–200)
VLDL Cholesterol, Calc: 31 mg/dL (ref 5–40)

## 2012-12-12 ENCOUNTER — Emergency Department: Payer: Self-pay | Admitting: Emergency Medicine

## 2012-12-12 LAB — COMPREHENSIVE METABOLIC PANEL
Albumin: 3.7 g/dL (ref 3.4–5.0)
BUN: 22 mg/dL — ABNORMAL HIGH (ref 7–18)
Bilirubin,Total: 0.4 mg/dL (ref 0.2–1.0)
Calcium, Total: 8.8 mg/dL (ref 8.5–10.1)
Chloride: 107 mmol/L (ref 98–107)
Co2: 30 mmol/L (ref 21–32)
Creatinine: 1.51 mg/dL — ABNORMAL HIGH (ref 0.60–1.30)
Glucose: 83 mg/dL (ref 65–99)
Potassium: 4.1 mmol/L (ref 3.5–5.1)
Sodium: 140 mmol/L (ref 136–145)

## 2012-12-12 LAB — CBC
HGB: 11.9 g/dL — ABNORMAL LOW (ref 13.0–18.0)
MCHC: 34 g/dL (ref 32.0–36.0)
Platelet: 144 10*3/uL — ABNORMAL LOW (ref 150–440)
RBC: 3.76 10*6/uL — ABNORMAL LOW (ref 4.40–5.90)
RDW: 13.1 % (ref 11.5–14.5)

## 2012-12-12 LAB — CK TOTAL AND CKMB (NOT AT ARMC): CK, Total: 105 U/L (ref 35–232)

## 2013-02-23 ENCOUNTER — Emergency Department: Payer: Self-pay | Admitting: Emergency Medicine

## 2013-02-23 LAB — BASIC METABOLIC PANEL
ANION GAP: 4 — AB (ref 7–16)
BUN: 25 mg/dL — ABNORMAL HIGH (ref 7–18)
CALCIUM: 8.2 mg/dL — AB (ref 8.5–10.1)
CHLORIDE: 108 mmol/L — AB (ref 98–107)
CREATININE: 1.59 mg/dL — AB (ref 0.60–1.30)
Co2: 27 mmol/L (ref 21–32)
GFR CALC AF AMER: 55 — AB
GFR CALC NON AF AMER: 48 — AB
GLUCOSE: 157 mg/dL — AB (ref 65–99)
Osmolality: 285 (ref 275–301)
Potassium: 3.8 mmol/L (ref 3.5–5.1)
Sodium: 139 mmol/L (ref 136–145)

## 2013-02-23 LAB — CBC
HCT: 33.2 % — ABNORMAL LOW (ref 40.0–52.0)
HGB: 11.2 g/dL — ABNORMAL LOW (ref 13.0–18.0)
MCH: 32.2 pg (ref 26.0–34.0)
MCHC: 33.6 g/dL (ref 32.0–36.0)
MCV: 96 fL (ref 80–100)
PLATELETS: 139 10*3/uL — AB (ref 150–440)
RBC: 3.46 10*6/uL — ABNORMAL LOW (ref 4.40–5.90)
RDW: 13.5 % (ref 11.5–14.5)
WBC: 5 10*3/uL (ref 3.8–10.6)

## 2013-02-23 LAB — TROPONIN I: TROPONIN-I: 0.08 ng/mL — AB

## 2013-03-28 ENCOUNTER — Emergency Department: Payer: Self-pay | Admitting: Emergency Medicine

## 2013-03-28 LAB — CBC
HCT: 36.7 % — AB (ref 40.0–52.0)
HGB: 12.7 g/dL — ABNORMAL LOW (ref 13.0–18.0)
MCH: 33.1 pg (ref 26.0–34.0)
MCHC: 34.5 g/dL (ref 32.0–36.0)
MCV: 96 fL (ref 80–100)
PLATELETS: 127 10*3/uL — AB (ref 150–440)
RBC: 3.82 10*6/uL — AB (ref 4.40–5.90)
RDW: 13.4 % (ref 11.5–14.5)
WBC: 3.7 10*3/uL — AB (ref 3.8–10.6)

## 2013-03-28 LAB — COMPREHENSIVE METABOLIC PANEL
ALK PHOS: 51 U/L
Albumin: 4 g/dL (ref 3.4–5.0)
Anion Gap: 3 — ABNORMAL LOW (ref 7–16)
BILIRUBIN TOTAL: 0.5 mg/dL (ref 0.2–1.0)
BUN: 23 mg/dL — ABNORMAL HIGH (ref 7–18)
CALCIUM: 8.4 mg/dL — AB (ref 8.5–10.1)
Chloride: 105 mmol/L (ref 98–107)
Co2: 29 mmol/L (ref 21–32)
Creatinine: 1.5 mg/dL — ABNORMAL HIGH (ref 0.60–1.30)
EGFR (African American): 59 — ABNORMAL LOW
EGFR (Non-African Amer.): 51 — ABNORMAL LOW
Glucose: 87 mg/dL (ref 65–99)
Osmolality: 277 (ref 275–301)
Potassium: 3.8 mmol/L (ref 3.5–5.1)
SGOT(AST): 23 U/L (ref 15–37)
SGPT (ALT): 22 U/L (ref 12–78)
SODIUM: 137 mmol/L (ref 136–145)
TOTAL PROTEIN: 7.4 g/dL (ref 6.4–8.2)

## 2013-03-28 LAB — PRO B NATRIURETIC PEPTIDE: B-Type Natriuretic Peptide: 805 pg/mL — ABNORMAL HIGH (ref 0–125)

## 2013-03-28 LAB — TSH: THYROID STIMULATING HORM: 0.28 u[IU]/mL — AB

## 2013-03-28 LAB — TROPONIN I: TROPONIN-I: 0.09 ng/mL — AB

## 2013-03-29 LAB — TROPONIN I: TROPONIN-I: 0.08 ng/mL — AB

## 2013-10-27 ENCOUNTER — Emergency Department: Payer: Self-pay | Admitting: Emergency Medicine

## 2013-11-12 ENCOUNTER — Observation Stay: Payer: Self-pay | Admitting: Internal Medicine

## 2013-11-12 LAB — BASIC METABOLIC PANEL
ANION GAP: 7 (ref 7–16)
BUN: 25 mg/dL — ABNORMAL HIGH (ref 7–18)
CALCIUM: 8.5 mg/dL (ref 8.5–10.1)
CREATININE: 1.52 mg/dL — AB (ref 0.60–1.30)
Chloride: 108 mmol/L — ABNORMAL HIGH (ref 98–107)
Co2: 26 mmol/L (ref 21–32)
EGFR (Non-African Amer.): 50 — ABNORMAL LOW
GLUCOSE: 77 mg/dL (ref 65–99)
Osmolality: 284 (ref 275–301)
Potassium: 4 mmol/L (ref 3.5–5.1)
Sodium: 141 mmol/L (ref 136–145)

## 2013-11-12 LAB — TROPONIN I
TROPONIN-I: 0.15 ng/mL — AB
Troponin-I: 0.14 ng/mL — ABNORMAL HIGH

## 2013-11-12 LAB — CBC
HCT: 36.8 % — AB (ref 40.0–52.0)
HGB: 12.1 g/dL — ABNORMAL LOW (ref 13.0–18.0)
MCH: 32.3 pg (ref 26.0–34.0)
MCHC: 33 g/dL (ref 32.0–36.0)
MCV: 98 fL (ref 80–100)
PLATELETS: 138 10*3/uL — AB (ref 150–440)
RBC: 3.76 10*6/uL — AB (ref 4.40–5.90)
RDW: 13.1 % (ref 11.5–14.5)
WBC: 4 10*3/uL (ref 3.8–10.6)

## 2013-11-12 LAB — CK-MB: CK-MB: 3.2 ng/mL (ref 0.5–3.6)

## 2013-11-13 LAB — CBC WITH DIFFERENTIAL/PLATELET
Basophil #: 0 10*3/uL (ref 0.0–0.1)
Basophil %: 0.2 %
Eosinophil #: 0 10*3/uL (ref 0.0–0.7)
Eosinophil %: 0.8 %
HCT: 36.7 % — ABNORMAL LOW (ref 40.0–52.0)
HGB: 12.3 g/dL — ABNORMAL LOW (ref 13.0–18.0)
Lymphocyte #: 1.2 10*3/uL (ref 1.0–3.6)
Lymphocyte %: 31.9 %
MCH: 32.5 pg (ref 26.0–34.0)
MCHC: 33.4 g/dL (ref 32.0–36.0)
MCV: 97 fL (ref 80–100)
Monocyte #: 0.3 x10 3/mm (ref 0.2–1.0)
Monocyte %: 9.4 %
Neutrophil #: 2.1 10*3/uL (ref 1.4–6.5)
Neutrophil %: 57.7 %
PLATELETS: 130 10*3/uL — AB (ref 150–440)
RBC: 3.78 10*6/uL — AB (ref 4.40–5.90)
RDW: 13.3 % (ref 11.5–14.5)
WBC: 3.7 10*3/uL — AB (ref 3.8–10.6)

## 2013-11-13 LAB — BASIC METABOLIC PANEL
ANION GAP: 7 (ref 7–16)
BUN: 22 mg/dL — ABNORMAL HIGH (ref 7–18)
CHLORIDE: 108 mmol/L — AB (ref 98–107)
CREATININE: 1.61 mg/dL — AB (ref 0.60–1.30)
Calcium, Total: 8.6 mg/dL (ref 8.5–10.1)
Co2: 28 mmol/L (ref 21–32)
GFR CALC AF AMER: 57 — AB
GFR CALC NON AF AMER: 47 — AB
GLUCOSE: 182 mg/dL — AB (ref 65–99)
OSMOLALITY: 293 (ref 275–301)
Potassium: 3.5 mmol/L (ref 3.5–5.1)
SODIUM: 143 mmol/L (ref 136–145)

## 2013-11-13 LAB — TROPONIN I: Troponin-I: 0.15 ng/mL — ABNORMAL HIGH

## 2013-11-13 LAB — CK-MB: CK-MB: 3 ng/mL (ref 0.5–3.6)

## 2013-12-28 DIAGNOSIS — F259 Schizoaffective disorder, unspecified: Secondary | ICD-10-CM

## 2013-12-28 HISTORY — DX: Schizoaffective disorder, unspecified: F25.9

## 2013-12-29 DIAGNOSIS — R451 Restlessness and agitation: Secondary | ICD-10-CM | POA: Insufficient documentation

## 2013-12-29 HISTORY — DX: Restlessness and agitation: R45.1

## 2014-03-17 ENCOUNTER — Observation Stay: Payer: Self-pay | Admitting: Internal Medicine

## 2014-05-02 ENCOUNTER — Inpatient Hospital Stay
Admit: 2014-05-02 | Disposition: A | Discharge status: Home / Self Care | Payer: Self-pay | Attending: Internal Medicine | Admitting: Internal Medicine

## 2014-05-02 LAB — COMPREHENSIVE METABOLIC PANEL
ALK PHOS: 45 U/L
ALT: 15 U/L — AB
Albumin: 4.2 g/dL
Anion Gap: 13 (ref 7–16)
BILIRUBIN TOTAL: 1.2 mg/dL
BUN: 28 mg/dL — AB
Calcium, Total: 9 mg/dL
Chloride: 105 mmol/L
Co2: 22 mmol/L
Creatinine: 1.61 mg/dL — ABNORMAL HIGH
EGFR (African American): 54 — ABNORMAL LOW
EGFR (Non-African Amer.): 47 — ABNORMAL LOW
Glucose: 119 mg/dL — ABNORMAL HIGH
POTASSIUM: 4 mmol/L
SGOT(AST): 36 U/L
Sodium: 140 mmol/L
TOTAL PROTEIN: 6.9 g/dL

## 2014-05-02 LAB — DRUG SCREEN, URINE
Amphetamines, Ur Screen: NEGATIVE
BARBITURATES, UR SCREEN: NEGATIVE
Benzodiazepine, Ur Scrn: NEGATIVE
CANNABINOID 50 NG, UR ~~LOC~~: NEGATIVE
Cocaine Metabolite,Ur ~~LOC~~: NEGATIVE
MDMA (Ecstasy)Ur Screen: NEGATIVE
Methadone, Ur Screen: NEGATIVE
Opiate, Ur Screen: NEGATIVE
Phencyclidine (PCP) Ur S: NEGATIVE
TRICYCLIC, UR SCREEN: NEGATIVE

## 2014-05-02 LAB — URINALYSIS, COMPLETE
Bilirubin,UR: NEGATIVE
Blood: NEGATIVE
GLUCOSE, UR: NEGATIVE mg/dL (ref 0–75)
Ketone: NEGATIVE
LEUKOCYTE ESTERASE: NEGATIVE
Nitrite: NEGATIVE
Ph: 5 (ref 4.5–8.0)
Protein: 30
Specific Gravity: 1.011 (ref 1.003–1.030)

## 2014-05-02 LAB — AMMONIA: Ammonia, Plasma: 29 umol/L

## 2014-05-02 LAB — CBC
HCT: 38.1 % — AB (ref 40.0–52.0)
HGB: 13 g/dL (ref 13.0–18.0)
MCH: 32.6 pg (ref 26.0–34.0)
MCHC: 34.2 g/dL (ref 32.0–36.0)
MCV: 95 fL (ref 80–100)
PLATELETS: 137 10*3/uL — AB (ref 150–440)
RBC: 4 10*6/uL — ABNORMAL LOW (ref 4.40–5.90)
RDW: 13.5 % (ref 11.5–14.5)
WBC: 11.2 10*3/uL — AB (ref 3.8–10.6)

## 2014-05-02 LAB — SALICYLATE LEVEL

## 2014-05-02 LAB — ETHANOL

## 2014-05-02 LAB — ACETAMINOPHEN LEVEL: Acetaminophen: <10 ug/mL

## 2014-05-02 LAB — LIPASE, BLOOD: Lipase: 38 U/L

## 2014-05-02 LAB — CK TOTAL AND CKMB (NOT AT ARMC)
CK, Total: 176 U/L
CK-MB: 6.3 ng/mL — ABNORMAL HIGH

## 2014-05-02 LAB — TROPONIN I: Troponin-I: 0.03 ng/mL

## 2014-05-03 LAB — CBC WITH DIFFERENTIAL/PLATELET
BASOS PCT: 0.1 %
Basophil #: 0 10*3/uL (ref 0.0–0.1)
Eosinophil #: 0 10*3/uL (ref 0.0–0.7)
Eosinophil %: 0.3 %
HCT: 35.9 % — ABNORMAL LOW (ref 40.0–52.0)
HGB: 12 g/dL — ABNORMAL LOW (ref 13.0–18.0)
LYMPHS ABS: 1 10*3/uL (ref 1.0–3.6)
LYMPHS PCT: 13.2 %
MCH: 32 pg (ref 26.0–34.0)
MCHC: 33.5 g/dL (ref 32.0–36.0)
MCV: 96 fL (ref 80–100)
MONO ABS: 0.5 x10 3/mm (ref 0.2–1.0)
MONOS PCT: 6.5 %
NEUTROS PCT: 79.9 %
Neutrophil #: 5.9 10*3/uL (ref 1.4–6.5)
Platelet: 125 10*3/uL — ABNORMAL LOW (ref 150–440)
RBC: 3.76 10*6/uL — AB (ref 4.40–5.90)
RDW: 13.3 % (ref 11.5–14.5)
WBC: 7.3 10*3/uL (ref 3.8–10.6)

## 2014-05-03 LAB — BASIC METABOLIC PANEL
ANION GAP: 11 (ref 7–16)
BUN: 28 mg/dL — ABNORMAL HIGH
CALCIUM: 8.7 mg/dL — AB
CHLORIDE: 108 mmol/L
Co2: 25 mmol/L
Creatinine: 1.57 mg/dL — ABNORMAL HIGH
EGFR (African American): 56 — ABNORMAL LOW
EGFR (Non-African Amer.): 48 — ABNORMAL LOW
Glucose: 108 mg/dL — ABNORMAL HIGH
Potassium: 4 mmol/L
SODIUM: 144 mmol/L

## 2014-05-04 LAB — CBC WITH DIFFERENTIAL/PLATELET
BASOS ABS: 0 10*3/uL (ref 0.0–0.1)
Basophil %: 0 %
EOS ABS: 0 10*3/uL (ref 0.0–0.7)
Eosinophil %: 0 %
HCT: 37.2 % — ABNORMAL LOW (ref 40.0–52.0)
HGB: 12.5 g/dL — ABNORMAL LOW (ref 13.0–18.0)
Lymphocyte #: 0.3 10*3/uL — ABNORMAL LOW (ref 1.0–3.6)
Lymphocyte %: 2.3 %
MCH: 32.4 pg (ref 26.0–34.0)
MCHC: 33.5 g/dL (ref 32.0–36.0)
MCV: 97 fL (ref 80–100)
MONOS PCT: 1.5 %
Monocyte #: 0.2 x10 3/mm (ref 0.2–1.0)
NEUTROS ABS: 12.9 10*3/uL — AB (ref 1.4–6.5)
Neutrophil %: 96.2 %
PLATELETS: 113 10*3/uL — AB (ref 150–440)
RBC: 3.85 10*6/uL — ABNORMAL LOW (ref 4.40–5.90)
RDW: 13.5 % (ref 11.5–14.5)
WBC: 13.5 10*3/uL — ABNORMAL HIGH (ref 3.8–10.6)

## 2014-05-04 LAB — BASIC METABOLIC PANEL
ANION GAP: 5 — AB (ref 7–16)
BUN: 27 mg/dL — ABNORMAL HIGH
CALCIUM: 9 mg/dL
CHLORIDE: 114 mmol/L — AB
Co2: 24 mmol/L
Creatinine: 1.43 mg/dL — ABNORMAL HIGH
EGFR (African American): >60
EGFR (Non-African Amer.): 54 — ABNORMAL LOW
GLUCOSE: 157 mg/dL — AB
Potassium: 4.4 mmol/L
Sodium: 143 mmol/L

## 2014-05-05 LAB — BASIC METABOLIC PANEL
Anion Gap: 7 (ref 7–16)
BUN: 38 mg/dL — AB
CREATININE: 1.46 mg/dL — AB
Calcium, Total: 9.5 mg/dL
Chloride: 110 mmol/L
Co2: 24 mmol/L
EGFR (African American): >60
EGFR (Non-African Amer.): 53 — ABNORMAL LOW
GLUCOSE: 163 mg/dL — AB
POTASSIUM: 4.8 mmol/L
Sodium: 141 mmol/L

## 2014-05-06 LAB — BASIC METABOLIC PANEL
Anion Gap: 8 (ref 7–16)
BUN: 40 mg/dL — ABNORMAL HIGH
CALCIUM: 9.1 mg/dL
CHLORIDE: 106 mmol/L
Co2: 28 mmol/L
Creatinine: 1.47 mg/dL — ABNORMAL HIGH
EGFR (African American): >60
EGFR (Non-African Amer.): 52 — ABNORMAL LOW
GLUCOSE: 100 mg/dL — AB
Potassium: 4.2 mmol/L
Sodium: 142 mmol/L

## 2014-05-06 LAB — CBC WITH DIFFERENTIAL/PLATELET
BASOS PCT: 0.6 %
Basophil #: 0.1 10*3/uL (ref 0.0–0.1)
EOS ABS: 0 10*3/uL (ref 0.0–0.7)
Eosinophil %: 0.1 %
HCT: 40.7 % (ref 40.0–52.0)
HGB: 13.5 g/dL (ref 13.0–18.0)
Lymphocyte #: 1.5 10*3/uL (ref 1.0–3.6)
Lymphocyte %: 13.8 %
MCH: 32 pg (ref 26.0–34.0)
MCHC: 33.2 g/dL (ref 32.0–36.0)
MCV: 96 fL (ref 80–100)
MONO ABS: 0.5 x10 3/mm (ref 0.2–1.0)
Monocyte %: 4.5 %
NEUTROS ABS: 8.6 10*3/uL — AB (ref 1.4–6.5)
NEUTROS PCT: 81 %
PLATELETS: 135 10*3/uL — AB (ref 150–440)
RBC: 4.24 10*6/uL — ABNORMAL LOW (ref 4.40–5.90)
RDW: 13.6 % (ref 11.5–14.5)
WBC: 10.7 10*3/uL — ABNORMAL HIGH (ref 3.8–10.6)

## 2014-05-14 NOTE — Discharge Summary (Signed)
PATIENT NAME:  Charles Knox, [place] MR#:  [id number] DATE OF BIRTH:  1955-09-17  DATE OF ADMISSION:  06/16/2012 DATE OF DISCHARGE:  06/17/2012  PRIMARY CARE PHYSICIAN: Evelene CroonMeindert Niemeyer, MD PRIMARY CARDIOLOGIST: Dwayne D. Callwood, MD   DISCHARGE DIAGNOSES: 1.  Chronic angina.  2.  Hypertension.   CONSULTATIONS: Dr. Gwen PoundsKowalski of Cardiology   IMAGING STUDIES: Include: A chest x-ray which showed no acute cardiopulmonary abnormalities.  Echocardiogram showed ejection fraction of 35 to 40%. Abnormal apex.   ADMITTING HISTORY AND PHYSICAL: Please see detailed history and physical dictated by [name] on 06/16/2012. In brief, a 59 year old patient with history of coronary artery disease, a prior CABG with chronic elevation troponin, presented to the hospital with recurrent chest pain. The patient was admitted to the hospitalist service for further work-up and treatment. The patient was seen by Dr. Gwen PoundsKowalski, who reviewed the patient's echocardiogram and suggested the patient follow up with Dr. Juliann Paresallwood as outpatient.  He did not feel like he needed any further investigation or procedures inpatient. The patient was chest pain free prior to discharge and is being discharged home after optimizing his medical treatment for coronary artery disease.   DISCHARGE MEDICATIONS:  Include: 1.  Abilify 20 mg oral once a day.  2.  Isosorbide mononitrate 60 mg oral once a day.  3.  Nitrostat 0.4 mg as needed for chest pain.  4.  Prilosec 20 mg oral 2 times a day.  5.  Ranexa 500 mg 2 tablets oral 2 times a day.  6.  Zoloft 100 mg oral once a day.  7.  Pravastatin 40 mg oral once a day.  8.  Lisinopril 5 mg oral once a day.  9.  Levothyroxine 75 mcg once a day.  10.  Metoprolol tartrate 25 mg oral 2 times a day.  11.  Advair Diskus 250/50, 1puff inhaled 2 times a day.  12.  Plavix 75 mg oral once a day.  13.  Aspirin 325 mg oral once a day.  14.  Montelukast 10 mg oral once a day.  15.  Docusate sodium 100 mg  oral 2 times a day.  16.  Ventolin HFA 1 puff inhaled once a day.  17.  ProAir 2 puffs inhaled 4 times a day as needed.  18.  Januvia 1 tablet oral once a day.   DISCHARGE INSTRUCTIONS:  1.  The patient was discharged home on carbohydrate-controlled, low-salt diet.  2.  Activity as tolerated.  3.  Follow up with his cardiologist, Dr. Juliann Paresallwood, on 06/18/2012 at 10:30 a.m.   TIME SPENT ON DAY OF DISCHARGE IN DISCHARGE ACTIVITY: 40 minutes.   ____________________________ Molinda BailiffSrikar R. Ralphine Hinks, MD srs:cb D: 06/18/2012 14:20:14 ET T: 06/18/2012 17:48:56 ET JOB#: [id number]  cc: Wardell HeathSrikar R. Marla Pouliot, MD, <Dictator> Orie FishermanSRIKAR R Shakerra Red MD ELECTRONICALLY SIGNED 07/15/2012 10:39

## 2014-05-14 NOTE — Consult Note (Signed)
Brief Consult Note: Diagnosis: Pt [place] who was [place] with chest pain with abnormal troponin. Has ss with ppm.   Patient was seen by consultant.   Recommend further assessment or treatment.   Discussed with Attending MD.   Comments: 59 yo male with history of cad s/p cabg who is admitted with chest pain and mild troponin elevation. Had funcitonal study in 5/14 which revealed ef of 40% with akinetic apex. He states his pain is similar to his angina. Will follow over night and consider cardiac cath in am.  Electronic Signatures: Dalia HeadingFath, Jan Walters A (MD)  (Signed [phone]-Nov-14 18:09)  Authored: Brief Consult Note   Last Updated: 18-Nov-14 18:09 by Dalia HeadingFath, Salimah Martinovich A (MD)

## 2014-05-14 NOTE — Consult Note (Signed)
Present Illness 59 yo male with history of cad s/p cabg in [place] who was admitted with recurrent chest pain similar to his angina. He has a mildly elevated serum troponin. He underwent a funcitonal study in 5/14 at [facility] which was abnormal with apical scar and ef of 40% but no reversible ischemia. He has been treated medically bt has noted recurrent chest pain similar to his angina. Pt has a pacemaker and has a pace v sense rhythm with rbbb. There is no ischemia   Physical Exam:  GEN well developed, no acute distress   HEENT PERRL, hearing intact to voice   NECK supple  No masses   RESP normal resp effort  clear BS  no use of accessory muscles   CARD Regular rate and rhythm  No murmur   ABD denies tenderness  no liver/spleen enlargement  no hernia  normal BS   LYMPH negative neck, negative axillae   EXTR negative cyanosis/clubbing, negative edema   SKIN normal to palpation   NEURO cranial nerves intact, motor/sensory function intact   PSYCH A+O to time, place, person   Review of Systems:  Subjective/Chief Complaint chest pain with radiation to his left arm.   General: No Complaints   Skin: No Complaints   ENT: No Complaints   Eyes: No Complaints   Neck: No Complaints   Respiratory: Short of breath   Cardiovascular: Chest pain or discomfort   Gastrointestinal: No Complaints   Genitourinary: No Complaints   Vascular: No Complaints   Musculoskeletal: No Complaints   Neurologic: No Complaints   Hematologic: No Complaints   Endocrine: No Complaints   Psychiatric: No Complaints   Review of Systems: All other systems were reviewed and found to be negative   Medications/Allergies Reviewed Medications/Allergies reviewed   Home Medications: Medication Instructions Status  Nitrostat 0.4 mg sublingual tablet 1 tab(s) sublingual every 5 minutes up to 3 doses as needed for chest pain.  Active  Travatan Z 0.004% ophthalmic solution 1 drop(s) to each  affected eye once a day for glaucoma. Active  Ranexa 500 mg oral tablet, extended release 2 tab(s) orally 2 times a day Active  lisinopril 5 mg oral tablet 1 tab(s) orally once a day. Active  levothyroxine 75 mcg (0.075 mg) oral tablet 1 tab(s) orally once a day. Active  metoprolol tartrate 25 mg oral tablet 1 tab(s) orally 2 times a day. Active  Advair Diskus 250 mcg-50 mcg inhalation powder 1 puff(s) inhaled 2 times a day.*rinse mouth after breathing* Active  aspirin 325 mg oral tablet 1 tab(s) orally once a day. Active  montelukast 10 mg oral tablet 1 tab(s) orally once a day Active  docusate sodium 100 mg oral capsule 1 cap(s) orally 2 times a day. Active  sertraline 100 mg oral tablet 1 tab(s) orally once a day Active  Plavix 75 mg oral tablet 1 tab(s) orally once a day Active  Ventolin HFA 90 mcg/inh inhalation aerosol 1 puff(s) inhaled 2 times a day Active  PriLOSEC 40 mg oral delayed release capsule 1 cap(s) orally 2 times a day Active  Pravachol 40 mg oral tablet 1 tab(s) orally once a day Active  isosorbide mononitrate 60 mg oral tablet, extended release 2 tab(s) orally once a day Active  Januvia 50 mg oral tablet 1 tab(s) orally once a day Active   EKG:  EKG NSR   Interpretation paced rhythm    Penicillin: Hives   Impression 59 yo male with history of cad s/p  cabg now admitted with progressive chest pain. He has a mild troponin elevation and ekg with atrial pacing and ventricular response with rbbb and no obvious ischmeia. He had a funcitonal study less than 6 months ago. Pt states the pain is similar to his angina. He has a mild troponin elevation with rbbb on ekg. Given rest pain and recent funcitonal study revealing fixed apiccal defect, willl need consideration for cardiac cath to evaluate anatomy   Plan 1. Conitnue current meds including nitrates, asa, beta blockers, ace i 2. Rule ot for mi 3. Cardiac cath in am 4 Further recs after cath   Electronic  Signatures: Dalia HeadingFath, Quiana Cobaugh A (MD)  (Signed [phone]-Nov-14 19:57)  Authored: General Aspect/Present Illness, History and Physical Exam, Review of System, Home Medications, EKG , Allergies, Impression/Plan   Last Updated: 18-Nov-14 19:57 by Dalia HeadingFath, Lai Hendriks A (MD)

## 2014-05-14 NOTE — H&P (Signed)
PATIENT NAME:  Charles Knox, Charles Knox MR#:  [id number] DATE OF BIRTH:  [birth year]  DATE OF ADMISSION:  12/09/2012  PRIMARY CARE PHYSICIAN: [name].   CHIEF COMPLAINT: Chest pain.   HISTORY OF PRESENT ILLNESS: This is a 59 year old male who presents to the hospital from a group home due to chest pain. The patient says that he was in his usual state of health when he woke up this morning around 6:00 a.m. and was having substernal chest pain also radiating down his left arm and into his neck. He also became a bit short of breath with it but no diaphoresis, no dizziness, no nausea, no vomiting. The patient does have a previous history of coronary artery disease and stent placement and bypass.  His symptoms were quite similar to his previous MI, therefore, he came to the ER for further evaluation. The patient was noted to have a mildly elevated troponin at 0.09 but no acute EKG changes. The patient has not had a recent cardiac work-up in the past year or so. Hospitalist services were contacted for further treatment and evaluation.   REVIEW OF SYSTEMS: CONSTITUTIONAL: No documented fever. No weight gain or weight loss.  EYES: No blurred or double vision.  ENT: No tinnitus. No postnasal drip. No redness of the oropharynx.  RESPIRATORY: No cough, no wheeze, hemoptysis, no dyspnea.  CARDIOVASCULAR: Positive chest pain, no orthopnea, no palpitations, no syncope.  GASTROINTESTINAL: No nausea no vomiting. No diarrhea. No abdominal pain. No melena or hematochezia.  GENITOURINARY: No dysuria or hematuria.  ENDOCRINE: No polyuria, nocturia, heat or cold intolerance.   HEMATOLOGIC:  No anemia, no bruising, no bleeding.  INTEGUMENTARY: No rashes. No lesions.  MUSCULOSKELETAL: No arthritis, no swelling, no gout.  NEUROLOGIC: No numbness or tingling. No ataxia. No seizure-type activity.  PSYCHIATRIC: No anxiety, no insomnia. No ADD. Positive depression.   PAST MEDICAL HISTORY: Consistent with history of coronary  artery disease status post stent placement and bypass, history of pacemaker, hypertension, diabetes, COPD, hypothyroidism, GERD, depression, glaucoma, schizoaffective disorder.   ALLERGIES: PENICILLIN.   SOCIAL HISTORY: Used to be a smoker, quit about 10 years ago. Does have a 20 pack-year smoking history. Also has a history of alcohol abuse. Also quit about 15 to 20 years ago. No illicit drug abuse. Currently resides at his group home.   FAMILY HISTORY: Both mother and father are alive. Both mother and father are diabetic. They both have a history of stroke.   CURRENT MEDICATIONS: Are as follows: Advair 250/50 one puff b.i.d., aspirin 325 mg daily, Colace 100 mg b.i.d., Imdur 60 mg 2 tabs daily, Januvia 50 mg daily, Synthroid 25 mcg daily, lisinopril 5 mg daily, metoprolol tartrate 25 mg b.i.d., Singulair 10 mg daily, Pravachol 40 mg at bedtime, Prilosec 40 mg b.i.d., Ranexa 1000 mg b.i.d., Zoloft 100 mg daily, Travatan eye drops 1 drop to each eye at bedtime and albuterol inhaler as needed.   PHYSICAL EXAMINATION:  VITAL SIGNS:  Presently is as follows: Temperature is 97, pulse 60, respirations 18, blood pressure 124/70, sats 97% on room air.  GENERAL: He is a pleasant-appearing male, in no apparent distress.  HEAD, EYES, EARS, NOSE, THROAT: He is atraumatic, normocephalic. Extraocular muscles are intact. Pupils equal and reactive to light. Sclerae anicteric. No conjunctival injection. No pharyngeal erythema.  NECK: Supple. There is no jugular venous distention. No bruits, no lymphadenopathy, no thyromegaly.  HEART: Regular rate and rhythm. No murmurs. No rubs, no clicks.  LUNGS: Clear to auscultation bilaterally. No rales,  no rhonchi, no wheezes.  ABDOMEN: Soft, flat, nontender, nondistended. Has good bowel sounds. No hepatosplenomegaly appreciated.  EXTREMITIES: No evidence of any cyanosis, clubbing, or peripheral edema. Has +2 pedal and radial pulses bilaterally.  NEUROLOGICAL: The patient is  alert, awake, and oriented x 3 with no focal motor or sensory deficits appreciated bilaterally.  SKIN: Moist and warm with no rashes appreciated.  LYMPHATIC: There is no cervical or axillary lymphadenopathy.   LABORATORY DATA: Serum glucose of 90, BUN 27, creatinine 1.4, sodium 143, potassium 3.8, chloride 111, bicarbonate 27. The patient's LFTs are within normal limits. Troponin 0.09. White cell count 4.8, hemoglobin 11.2, hematocrit 32.7, platelet count 143.   The patient did have a chest x-ray done which showed no evidence of acute cardiopulmonary disease. EKG shows a paced rhythm with no acute ST changes.   ASSESSMENT AND PLAN: This is a 59 year old male with a history of coronary artery disease, status post bypass and cardiac stents, history of pacemaker, hypertension, diabetes, COPD, hypothyroidism GERD, depression, schizoaffective disorder, glaucoma, who presents to the hospital with chest pain and noted to have a mildly elevated troponin.   PROBLEMS: 1.  Chest pain with elevated troponin. The patient does have significant risk factors for coronary artery disease given his previous history of bypass and stent placement, hypertension, remote tobacco abuse and diabetes. He has had no recent cardiac work-up done in the past year or so.  His troponin although is mildly elevated but it seems to be chronically elevated at the same level. His EKG does not show any acute ST changes. For now, I will observe him overnight on telemetry, follow serial cardiac markers, obtain a Myoview in the morning. Get a Cardiology consult. I discussed the case with Dr. Lady GaryFath, who will see the patient.  I will continue the patient's aspirin, Plavix, metoprolol, Pravachol, Imdur and Ranexa for now.  2.  Hypertension. The patient is presently hemodynamically stable. I will continue his Imdur, lisinopril and metoprolol.  3.  Chronic obstructive pulmonary disease. No evidence of acute chronic obstructive pulmonary disease  exacerbation. Continue with his Advair and p.r.n. albuterol.  4.  Diabetes: Continue with his Januvia and place him on a carb-controlled diet.  5.  Hypothyroidism. Continue Synthroid.  6.  Gastroesophageal reflux disease. Continue omeprazole.  7.  Depression. Continue Zoloft.  8.  Glaucoma. Continue with the Travatan eye drops.   CODE STATUS: The patient is a FULL CODE.   Time Spent On Admission:  45 minutes.    ____________________________ Rolly PancakeVivek J. Cherlynn KaiserSainani, MD vjs:dp D: 12/09/2012 15:25:32 ET T: 12/09/2012 15:35:16 ET JOB#: [id number]  cc: Rolly PancakeVivek J. Cherlynn KaiserSainani, MD, <Dictator> Houston SirenVIVEK J SAINANI MD ELECTRONICALLY SIGNED 12/10/2012 13:49

## 2014-05-14 NOTE — H&P (Signed)
PATIENT NAME:  Charles MerlesSMITH, Charles Knox MR#:  [id number] DATE OF BIRTH:  08-25-1955  DATE OF ADMISSION:  06/16/2012  REFERRING PHYSICIAN: Caleen Jobshristine M. Braud, MD  PRIMARY CARE PHYSICIAN: Meindert A. Lacie ScottsNiemeyer, MD  CARDIOLOGIST: Bobbie Stackwayne D. Callwood, MD  CHIEF COMPLAINT: Chest pain.   HISTORY OF PRESENT ILLNESS: The patient is a pleasant 59 year old Caucasian male with multiple comorbidities including CAD, status post CABG, repeat cardiac catheterization last year showing occluded LAD graft and medical management was recommended, who appears to have chronic elevation of troponin, who presents with off-and-on chest pain since Friday. The pain occasionally radiates to the left shoulder, is sharp, and sometimes he has shortness of breath with. He came to the ER yesterday but was discharged. He stated that he had an appointment with his primary cardiologist last month, which he missed, but another one was scheduled for tomorrow. He came in today after experiencing chest pain again, similar fashion, worse than prior, better currently. It lasted about 40 minutes. Again, he was noted to have elevated troponin. His troponin today has been about the same trend as he has had in the last year or so. Hospitalist services were contacted for further evaluation and management.   PAST MEDICAL HISTORY: Multiple including hypothyroidism, CAD status post MI and CABG, sick sinus syndrome status post pacemaker, COPD, glaucoma, history of tic exposure, hyperlipidemia, diabetes, hypertension,  CKD stage III, GERD, OSA on CPAP, history of depression, schizoaffective disorder.   SOCIAL HISTORY: Lives in a group home. History of prior smoking, quit now. No alcohol or drugs.   FAMILY HISTORY: Positive for CAD in grandfather. Stroke in both parents.   ALLERGIES: PENICILLIN.   OUTPATIENT MEDICATIONS: Abilify 20 mg daily, Advair 250/50 mcg inhaled 1 puff 2 times a day, aspirin 325 mg daily, Plavix 75 mg daily, docusate sodium 100 mg 2  times a day, isosorbide mononitrate 60 mg extended-release 1 tab once a day, Januvia 1 tab daily, levothyroxine 75 mcg daily, lisinopril 5 mg daily, metoprolol tartrate 25 mg 2 times a day, montelukast 10 mg daily, Nitrostat sublingual 0.4 mg every 5 minutes as needed x 3 maximum for chest pain, pravastatin 40 mg daily, Prilosec OTC 20 mg 2 times a day, ProAir HFA CFC-free 90 mcg 2 puffs 4 times a day as needed, Ranexa 1000 mg 2 times a day, Travatan Z 1 drop to each eye once a day, Ventolin 1 puff once a day, Zoloft 100 mg daily.  REVIEW OF SYSTEMS:    CONSTITUTIONAL: Denies fever or chills or fatigue.  EYES: No blurry vision or double vision acutely, but  has chronic blurry vision, and this is no worse than his baseline. Has a history of glaucoma.  EARS, NOSE, THROAT: No tinnitus or hearing loss.  RESPIRATORY: Has occasional shortness of breath. History of COPD. Has a dry cough occasionally.  CARDIOVASCULAR: Chest pain as above, which he states does improve with nitroglycerin. Occasional orthopnea. No arrhythmia. Has no palpitations,  GASTROINTESTINAL: No nausea, vomiting, diarrhea, abdominal pain, melena. Has occasional constipation.  GENITOURINARY: Denies dysuria or hematuria.  HEMATOLOGIC AND LYMPHATIC: Denies anemia or easy bruising.  SKIN: No new rashes.  MUSCULOSKELETAL: Denies arthritis.  NEUROLOGIC: Denies focal weakness or numbness.  PSYCHIATRIC: Has depression and schizoaffective disorder.   PHYSICAL EXAMINATION: VITAL SIGNS: Temperature on arrival 98.2, pulse rate 73, respiratory rate 18, blood pressure 125/74, O2 sats 100% on room air.  GENERAL: The patient is a Caucasian male, sitting on bed, looks older than age noted in chart.  HEENT: Normocephalic,  atraumatic. Pupils are equal and reactive. Anicteric sclerae. Extraocular muscles intact. Moist mucous membranes.  NECK: Supple. No thyroid tenderness. No cervical lymphadenopathy.  CARDIOVASCULAR: S1, S2, irregularly irregular. No  significant murmurs appreciated.  LUNGS: Clear to auscultation without wheezing, rhonchi or rales.  ABDOMEN: Soft, nontender, nondistended. Positive bowel sounds in all quadrants.  EXTREMITIES: With 1+ lower extremity edema to mid shins with chronic venous stasis discoloration. SKIN: Otherwise does not show any acute rashes.  PSYCHIATRIC: Awake, alert, oriented x 3, cooperative.  NEUROLOGIC: Cranial nerves II through XII are grossly intact. Strength is 5/5 in all extremities.   LABORATORY AND DIAGNOSTIC DATA: Glucose 134, BUN 19, creatinine 1.4, potassium 3.9, sodium 141. Troponin today is 0.06, yesterday was 0.07 x 2. Of note, the patient had troponins again in January and again in February which were all less than 0.1 but elevated. Yesterday, TSH was 0.2. EKG: Normal sinus rhythm, rate 71, no acute ST elevations or depressions. There are Q waves and some T wave inversions in inferior leads, which are similar to prior.   ASSESSMENT AND PLAN: We have a 59 year old male with schizoaffective disorder, multiple comorbidities for coronary artery disease including myocardial infarction and coronary artery bypass grafting, who has had a catheterization last year and was supposed to see Dr.  Juliann Pares tomorrow, who presents with off-and-on crescendo chest pain relieved with nitroglycerin. At this point, we would admit the patient for observation to telemetry. We would cycle the troponins and consult cardiology. Of note, the patient has known occluded graft via catheterization last year. We would resume his outpatient medications of aspirin, Plavix, beta blocker, Ranexa and Imdur, but had nitroglycerin as well topically. His chest pain is better now. It is unclear if he will need a catheterization, but we will leave that to the cardiologist. He has no acute EKG changes compared to prior. We would continue his other blood pressure medications otherwise. Continue sliding scale insulin for his diabetes. Check a  hemoglobin A1C as well as a lipid profile. His TSH was a little bit on the lower side yesterday and therefore would cut back on the levothyroxine. His chronic kidney disease appears to be stable. We would wait for further cardiology input. We would continue proton pump inhibitor.   CODE STATUS: The patient is full code.   TOTAL TIME SPENT: 45 minutes.    ____________________________ Krystal Eaton, MD sa:jm D: 06/16/2012 20:50:46 ET T: 06/16/2012 21:09:28 ET JOB#: [id number]  cc: Krystal Eaton, MD, <Dictator> Meindert A. Lacie Scotts, MD Krystal Eaton MD ELECTRONICALLY SIGNED 06/24/2012 20:05

## 2014-05-14 NOTE — Discharge Summary (Signed)
PATIENT NAME:  Charles Knox, BORBA MR#:  [id number] DATE OF BIRTH:  Sep 28, 1955  DATE OF ADMISSION:  12/09/2012 DATE OF DISCHARGE:  12/10/2012  ADMITTING DIAGNOSIS:  Chest pain.   DISCHARGE DIAGNOSES:   1.  Unstable angina with mildly elevated troponin, no acute coronary syndrome according to cardiologist, [name], likely related to hypertension.  2.  Status post cardiac catheterization on 12/10/2012 by [name], showing patent left internal mammary artery to left anterior descending, occluded left anterior descending to small distal left anterior descending with distal vessel filling from right coronary artery.  No significant disease in the left circumflex or right coronary artery.  No change from prior cardiac catheterization done approximately 1-1/2 years ago.  3.  History of hyperlipidemia with LDL 67.  4.  Renal insufficiency, chronic. 5.  Solitary kidney.  6.  Coronary artery disease, status post coronary artery bypass grafting and permanent pacemaker placement.  7.  Hypertension.  8.  Diabetes mellitus. 9.  Chronic obstructive pulmonary disease.  10.  Hypothyroidism.   11.  Gastroesophageal reflux disease.  12.  Depression.  13.  Glaucoma.  14.  History of schizoaffective disorder and the patient was advised to have kidney function test checked in the next 2 to 3 days after discharge.  Also to have his blood pressure readings followed, especially since he is getting additional lisinopril dose in the evening.   DISCHARGE MEDICATIONS:  1.  Nitrostat 0.4 mg sublingually every five minutes as needed.  2.  Travatan 0.004% ophthalmic solution one drop to each affected eye once daily for glaucoma. 3.  Ranexa 1 gram twice daily.  4.  Levothyroxine 75 mcg p.o. daily.  5.  Metoprolol tartrate 25 mg p.o. twice daily.  6.  Advair Diskus 250/50 1 puff twice daily.  7.  Aspirin 325 mg p.o. daily.  8.  Latanoprost 10 mg p.o. daily.  9.  Docusate sodium 100 mg p.o. twice daily.  10.  Sertraline 100  mg p.o. daily.  11.  Plavix 75 mg p.o. daily.  12.  Ventolin 1 puff twice daily.  13.  Pravachol 40 mg p.o. daily.  14.  Isosorbide mononitrate 120 mg p.o. daily.  15.  Januvia 50 mg p.o. daily.  16.  Pantoprazole 40 mg p.o. twice daily.  The patient was changed from Prilosec to pantoprazole since Prilosec can interfere with Plavix.  17.  Lisinopril 5 mg p.o. twice daily.   HOME OXYGEN:  None.   DIET:  2 gram salt, low fat, low cholesterol, carbohydrate-controlled diet, regular consistency.    ACTIVITY LIMITATIONS:  As tolerated.  The patient was advised not to do heavy lifting in the next few days and gradually advance his activity over the next week.   FOLLOWUP APPOINTMENT:  With [name] in one week after discharge.  [name] in two days after discharge.   CONSULTANTS:  [name].   RADIOLOGIC STUDIES:  Chest x-ray, portable, single view, 12/09/2012, no acute cardiopulmonary abnormality.  Cardiac catheterization, 12/10/2012, by [name] yielded significant single vessel coronary artery disease, coronary circulation.  The left main vessel was normal-sized.  Angiography showed mild atherosclerosis, proximal LAD there was 100% stenosis, proximal circumflex 20% stenosis, mid circumflex 20% stenosis, the first obtuse marginal was 50% stenosis, RCA, the vessel was normal size.  Angiography showed minor luminal irregularities, graft to LAD, graft was a small-sized LIMA.  Graft angiography showed mild tortuosity.  There was a small vascular territory distal to the lesion.  Distal vessel angiography showed  a small-sized vessel with severe diffuse disease.  Ventricles, no left ventricular or global or regional wall motion abnormalities were noted.    HOSPITAL COURSE:  The patient is a 59 year old Caucasian male with history of coronary artery disease who presented to the hospital on 12/09/2012 with complaints of chest pain.  Please refer to [name] admission note on 12/09/2012.  On arrival to  the hospital, the patient was complaining of chest pain which he noted upon awakening in the morning.  He also had some radiation of this pain to the left arm as well as his neck.  He was complaining of shortness of breath, but denied any other significant abnormalities.  His symptoms were similar to prior heart attack.  He was admitted to the hospital for further evaluation.  His vital signs on the day of admission revealed temperature of 97, pulse was 60, respiration rate was 18, blood pressure 124/70.  O2 sats were 97% on room air.  Physical exam was unremarkable.  The patient's EKG showed paced rhythm with no acute ST-T changes.  Lab data done in the Emergency Room on the day of admission 12/09/2012 revealed elevation of BUN as well as creatinine 27 and 1.43, respectively, otherwise BMP was unremarkable.  The patient's liver enzymes showed albumin level of 3.3.  Cardiac enzymes, troponin first set showed elevation of 0.09, second set 0.09, third set 0.10, fourth set 0.09.  CBC, white blood cell count was 4.8, hemoglobin 11.2 and platelet count 243.  The patient was admitted to the hospital for further evaluation.  His cardiac enzymes were cycled and cardiology consultation with [name] was obtained.  The patient was recommended to proceed to the cardiac catheterization lab and cardiac catheterization was performed on 12/09/2012.  Essentially there was no significant change since prior cardiac catheterization done approximately 1-1/2 years ago by [name].  [name] recommended the patient to resume all prior activities in the next five days and follow a low fat, low calorie diet as well as carbohydrate-controlled diet.  The patient received IV fluids after which his blood pressure increased significantly.  On the day of admission, the patient's blood pressure was reasonably controlled around 120; however it was noted to be fluctuating.  Systolic blood pressure ranging intermittently between 140 to 160s.  It was  felt that  patient's blood pressure control issues, could have been contributing to his chest pain symptoms as well as mild elevation of troponin since there was no evidence of acute coronary syndrome during cardiac catheterization.  It was felt that the patient may benefit from advancement of his blood pressure medications and lisinopril dose was increased from 5 mg once daily dose to 5 mg twice daily dose.  It is recommended to follow the patient's blood pressure readings and make decisions about advancement or decreasing his lisinopril dose depending on his needs.  It is also recommended to follow the patient's creatinine levels since he is now on high doses of ACE inhibitor which may worsen his chronic kidney function, especially since the patient has, according to his medical history, only solitary kidney.  The patient was given IV fluids perioperatively and his kidney function essentially remained the same.  On the day of admission, the patient's creatinine was 1.43.  On the day of discharge, after IV administration, his creatinine was 1.39.  Again, it is recommended to follow the patient's creatinine level closely with this advanced dose of Lisinopril.  In regards to coronary artery disease, it was found  that patient should continue cholesterol-lowering medications.  His LDL was found to be in good control, was found to be in the 60s, in fact was 67.  The patient's total cholesterol was 129, triglycerides were 155 and HDL was 31.  The patient was recommended to continue his usual dose of statin.  He also was recommended to continue aspirin and Plavix.  Since the patient was on Prilosec, which is known to have interaction with Plavix, it was felt that the patient would benefit from discontinuation of this particular medication and initiation of Protonix or Nexium for gastroesophageal reflux disease.  For this reason, Prilosec was changed to Protonix.  In regards to chronic medical problems such as COPD,  hypothyroidism, glaucoma, depression, schizoaffective disorder, the patient is to continue his outpatient management.  No changes were made.  For his angina, no changes were made.  The patient is to continue Imdur as well as Ranexa.  For diabetes, the patient's blood glucose levels were found to be somewhat low with his usual doses of Januvia; however, the patient was n.p.o. for cardiac catheterization during the same time.  It is recommended however, to follow the patient's blood glucose levels very closely as outpatient and make decisions about discontinuation of Januvia if hypoglycemia persists.  It is known that hypoglycemia in patients with coronary artery disease can be a cause of the patient's deterioration.    On the day of discharge, the patient felt satisfactory, did not complain of any significant discomfort.  He had no chest pains.  His vital signs were stable with temperature of 97.4, pulse 60, respiratory rate was 16 to 20, blood pressure 158/89, saturation was 100% on room air.  He is being discharged in stable condition with the above-mentioned medications and follow-up.   TIME SPENT:  40 minutes.   ____________________________ Katharina Caperima Lasheba Stevens, MD rv:ea D: 12/11/2012 19:30:00 ET T: 12/11/2012 23:03:24 ET JOB#: [id number]  cc: Katharina Caperima Cleda Imel, MD, <Dictator> Kelleen Stolze MD ELECTRONICALLY SIGNED 12/30/2012 15:35

## 2014-05-14 NOTE — Consult Note (Signed)
General Aspect Due to seizure O male history of CAD status post bypass with cardiac catheterization last year revealing occluded LAD with medical management recommended.  Patient has been treated several times in the hospital for anginal symptoms.  His troponins are minimally elevated similar to his previous hospitalizations.He currently is without chest pain.  States he usually has more chest pain if he gets stressed or has to walk a long distance.  He also reports he has sleep apnea but has not used his CPAP for the last 4-6 weeks due to the mask being faulty and being thrown away.  He states he has not been able to get his supply company to send out another mask.  When he developed chest pain yesterday he took several TUMS and nitroglycerin and when there was no relief, he presented to the ER.  There Half been no acute EKG changes.  He is currently pain free.He states he is compliant with his medications. Was treated in the ER and released recently and was pending appointment  with Dr. Clayborn Bigness  prior to being admitted. He has had his pacemaker recently interrogated and was found to be normal functioning pacemaker without significant arrhythmia. Echocardiogram pending.   Physical Exam:  GEN no acute distress   HEENT pink conjunctivae   RESP normal resp effort   CARD Regular rate and rhythm   ABD denies tenderness   EXTR TED hose on, no apparent edema   SKIN skin turgor decreased   NEURO cranial nerves intact, motor/sensory function intact   PSYCH alert, poor insight   Review of Systems:  Subjective/Chief Complaint Chest pain   Cardiovascular: Chest pain or discomfort  Somewhat chronic but currently pain free   Review of Systems: All other systems were reviewed and found to be negative   Lab Results: Thyroid:  27-May-14 06:31   Thyroxine, Free 1.03 (Result(s) reported on 17 Jun 2012 at 10:06AM.)  Routine Chem:  26-May-14 15:13   Result Comment troponin - RESULTS VERIFIED BY  REPEAT TESTING.  - Ellamae Sia Adams '@16' :02 06-16-12 by [place]  - READ-BACK PROCESS PERFORMED.  Result(s) reported on 16 Jun 2012 at 04:05PM.  Glucose, Serum  134  BUN  19  Creatinine (comp)  1.40  Sodium, Serum 141  Potassium, Serum 3.9  Chloride, Serum  109  CO2, Serum 26  Calcium (Total), Serum  8.0  Anion Gap  6  Osmolality (calc) 285  eGFR (African American) >60  eGFR (Non-African American)  56 (eGFR values <49m/min/1.73 m2 may be an indication of chronic kidney disease (CKD). Calculated eGFR is useful in patients with stable renal function. The eGFR calculation will not be reliable in acutely ill patients when serum creatinine is changing rapidly. It is not useful in  patients on dialysis. The eGFR calculation may not be applicable to patients at the low and high extremes of body sizes, pregnant women, and vegetarians.)  27-May-14 06:31   Result Comment TROPONIN - RESULTS VERIFIED BY REPEAT TESTING.  - PREV. C/ 06-16-12 '@1602'  BY LJW..Marland KitchenJO  Result(s) reported on 17 Jun 2012 at 07:15AM.  Glucose, Serum 85  BUN  20  Creatinine (comp)  1.43  Sodium, Serum 144  Potassium, Serum 4.3  Chloride, Serum  109  CO2, Serum 28  Calcium (Total), Serum 8.8  Anion Gap 7  Osmolality (calc) 289  eGFR (African American) >60  eGFR (Non-African American)  54 (eGFR values <677mmin/1.73 m2 may be an indication of chronic kidney disease (CKD). Calculated eGFR is useful in  patients with stable renal function. The eGFR calculation will not be reliable in acutely ill patients when serum creatinine is changing rapidly. It is not useful in  patients on dialysis. The eGFR calculation may not be applicable to patients at the low and high extremes of body sizes, pregnant women, and vegetarians.)  Magnesium, Serum 1.8 (1.8-2.4 THERAPEUTIC RANGE: 4-7 mg/dL TOXIC: > 10 mg/dL  -----------------------)  Hemoglobin A1c ([facility]) 5.1 (The American Diabetes Association recommends that a primary goal of therapy  should be <7% and that physicians should reevaluate the treatment regimen in patients with HbA1c values consistently >8%.)  Cholesterol, Serum 166  Triglycerides, Serum 110  HDL (INHOUSE)  32  VLDL Cholesterol Calculated 22  LDL Cholesterol Calculated  112 (Result(s) reported on 17 Jun 2012 at 06:54AM.)  Cardiac:  26-May-14 15:13   CK, Total 86  CPK-MB, Serum 1.6 (Result(s) reported on 16 Jun 2012 at 04:00PM.)  Troponin I  0.06 (0.00-0.05 0.05 ng/mL or less: NEGATIVE  Repeat testing in 3-6 hrs  if clinically indicated. >0.05 ng/mL: POTENTIAL  MYOCARDIAL INJURY. Repeat  testing in 3-6 hrs if  clinically indicated. NOTE: An increase or decrease  of 30% or more on serial  testing suggests a  clinically important change)  27-May-14 06:31   CK, Total 63  CPK-MB, Serum 1.2 (Result(s) reported on 17 Jun 2012 at 07:12AM.)  Troponin I  0.06 (0.00-0.05 0.05 ng/mL or less: NEGATIVE  Repeat testing in 3-6 hrs  if clinically indicated. >0.05 ng/mL: POTENTIAL  MYOCARDIAL INJURY. Repeat  testing in 3-6 hrs if  clinically indicated. NOTE: An increase or decrease  of 30% or more on serial  testing suggests a  clinically important change)  Routine Hem:  27-May-14 06:31   WBC (CBC) 4.7  RBC (CBC)  4.00  Hemoglobin (CBC)  12.6  Hematocrit (CBC)  36.5  Platelet Count (CBC) 207  MCV 91  MCH 31.5  MCHC 34.5  RDW 13.5  Neutrophil % 77.1  Lymphocyte % 17.4  Monocyte % 4.9  Eosinophil % 0.4  Basophil % 0.2  Neutrophil # 3.6  Lymphocyte #  0.8  Monocyte # 0.2  Eosinophil # 0.0  Basophil # 0.0 (Result(s) reported on 17 Jun 2012 at 07:17AM.)   Radiology Results: XRay:    26-May-14 15:08, Chest PA and Lateral  Chest PA and Lateral   REASON FOR EXAM:    Chest pain  COMMENTS:   LMP: (Male)    PROCEDURE: DXR - DXR CHEST PA (OR AP) AND LATERAL  - Jun 16 2012  3:08PM     RESULT: Cardiac pacer with lead tips in right atrium and right ventricle.   Cardiomegaly. Normal pulmonary  vascularity. Chest is stable from prior   study of 06/15/2012.    IMPRESSION:  No acute abnormality. Cardiac pacer. Stable cardiomegaly. No   CHF.        Verified By: Osa Craver, M.D., MD    Penicillin: Hives  Vital Signs/Nurse's Notes: **Vital Signs.:   27-May-14 10:10  Vital Signs Type Routine  Temperature Temperature (F) 97.3  Celsius 36.2  Temperature Source oral  Pulse Pulse 63  Respirations Respirations 18  Systolic BP Systolic BP 106  Diastolic BP (mmHg) Diastolic BP (mmHg) 89  Mean BP 107  Pulse Ox % Pulse Ox % 100  Pulse Ox Activity Level  At rest  Oxygen Delivery Room Air/ 21 %    Impression 81 showed-year-old male with CAD status post bypass and occluded LAD with chronic anginal  symptoms and chronic mild elevation  of  troponin, thought to be demand ischemia and not acute coronary syndrome currently pain free.   Plan 1. Continue medical management and conservative therapy.  Echocardiogram is pending.  Further recommendations per Dr. Nehemiah Massed once results are known.   Electronic Signatures: Roderic Palau (NP)  (Signed 27-May-14 13:13)  Authored: General Aspect/Present Illness, History and Physical Exam, Review of System, Labs, Radiology, Allergies, Vital Signs/Nurse's Notes, Impression/Plan   Last Updated: 27-May-14 13:13 by Roderic Palau (NP)

## 2014-05-14 NOTE — Discharge Summary (Signed)
PATIENT NAME:  Charles Knox, Charles Knox MR#:  [id number] DATE OF BIRTH:  April 09, 1955  DATE OF ADMISSION:  12/09/2012 DATE OF DISCHARGE:  12/10/2012  ADMITTING DIAGNOSIS: Chest pain.   DISCHARGE DIAGNOSES:   1.  Unstable angina with mildly elevated troponin. No acute coronary syndrome, according to the cardiologist. Likely related to hypertension.  2.  Status post cardiac catheterization on 12/10/2012, by [name], showing patent left internal mammary artery to left anterior descending, occluded left anterior descending to small distal left anterior descending with distal vessel filling from right coronary artery.  No significant disease in the left circumflex or right coronary artery. No change from prior cardiac catheterization done  approximately 1-1/2 years ago.  3.  History of hyperlipidemia with LDL of 67.  4.  Renal insufficiency, chronic.  5.  Solitary kidney. 6.  Coronary artery disease status post coronary artery bypass grafting and permanent pacemaker placement.  7.  Hypertension.  8.  Diabetes mellitus.  9.  Chronic obstructive pulmonary disease.  10.  Hypothyroidism.  11. Gastroesophageal reflux disease.  12.  Depression.  13.  Glaucoma.  14.  Schizoaffective disorder.   The patient was advised to have kidney function checked in the next 2 to 3 days after discharge. Also follow up blood pressure readings, especially now since he is getting additional lisinopril dose in the evening.   DISCHARGE MEDICATIONS:  1.  Nitrostat 0.4 mg sublingually every 5 minutes as needed.  2.  Travatan 0.004% ophthalmic solution 1 drop to each affected eye once daily for glaucoma.  3.  Ranexa 1000 mg twice daily.  4.  Levothyroxine 75 mcg p.o. daily.  5.  Metoprolol tartrate 25 mg p.o. twice daily.  6.  Advair Diskus 250/50, 1 puff twice daily.  7.  Aspirin 325 mg p.o. daily.  8.  Montelukast 10 mg p.o. daily.  9.  Docusate sodium 100 mg p.o. twice daily.  10.  Sertraline 100 mg p.o. daily.  11.   Plavix 75 mg daily.  12.  Ventilator HFA 1 puff twice daily.  13.  Pravachol 40 mg p.o. daily.  14.  Isosorbide mononitrate 120 mg once daily.  15.  Januvia 50 mg p.o. daily.  16.  Pantoprazole 40 mg p.o. twice daily. Pantoprazole was changed from Prilosec, which the patient was using in the past. 17.  New dose for lisinopril 5 mg p.o. twice daily.   HOME OXYGEN: None.   DIET: Two grams salt, low-fat, low-cholesterol, carbohydrate-controlled diet, regular consistency.   ACTIVITY LIMITATIONS: As directed. The patient was advised not to do any heavy lifting over the next few days.     FOLLOWUP APPOINTMENT: With [name] in 1 week after discharge, [name] in 2 days after discharge.    CONSULTANTS: [name].   RADIOLOGIC STUDIES: Chest x-ray, portable, single view, 12/09/2012, showed no acute cardiopulmonary abnormality. Cardiac catheterization, 12/10/2012, by [name], revealed significant single vessel coronary artery disease, coronary circulation left main, vessel was normal sized. Angiography showed mild atherosclerosis. Proximal LAD, there was 100% stenosis. Proximal circumflex was 20% stenosis. Mid circumflex was 20% stenosis. First obtuse marginal, there was 50% stenosis. RCA, the vessel was normal sized. Angiography showed minor luminal irregularities. Graft to the LAD. Graft was a small size LIMA. Graft angiography showed mild tortuosity. There was a small vascular territory distal to the lesion. Distal vessel angiography showed a small-sized vessel with severe diffuse disease. Ventricles: No left ventricular global or regional wall motion abnormalities were noted   The patient  is 59 year old Caucasian male with a history of coronary artery disease, who presented to the hospital on 12/09/2012, with complaints of chest pains. Please refer to Dr. Hilbert Odor  admission note on 12/09/2012. On arrival to the hospital, the patient was complaining of chest pains upon awakening in the morning.  He was also having radiation to the left arm as well as neck.  He also was complaining of shortness of breath, but denied any other significant abnormalities. His symptoms were similar to  prior heart attack, so he was admitted for further evaluation. His vital signs on the day of admission revealed a temperature 97, pulse was 60. Respiration rate was 18, blood pressure 124/70. Saturation was 97% on room air.  Physical exam was unremarkable. The patient's EKG showed a paced rhythm with no acute ST-T changes.  Laboratory done in the Emergency Room on the day of admission, 12/09/2012, revealed elevation of BUN and creatinine of 27 and 1.43, respectively. Otherwise, BMP was unremarkable. The patient's liver enzymes revealed albumin level of 3.3. Cardiac enzymes: Troponin on the first set showed elevation of 0.09, second set of 0.09, third set 0.10 and fourth set of 0.09. CBC: White blood cell count was 4.8, hemoglobin 11.2 and platelet count 143. The patient was admitted to the hospital for further evaluation. His cardiac enzymes were cycled and cardiology consultation with [name] was obtained. The patient was recommended to go to cardiac catheterization lab, and cardiac catheterization was performed on 12/09/2012.  Essentially, there was no significant change since prior cardiac catheterization done approximately 1-1/2 years ago. [name] recommended that the patient resume all of the prior activities in the next 5 days, and follow low-fat, low-calorie diet, as well as carbohydrate-controlled diet.   The patient received IV fluids, after which his blood pressure increased significantly. On the day of admission, the patient's blood pressure was reasonably controlled, around 120; however, later  it was noted to be fluctuating with systolic blood pressure ranging intermittently at around 140s to 160s.  It was felt that the patient's blood pressure could have been contributing to his chest pain symptoms, as well as  mild elevation of troponin, since there was no evidence of acute coronary syndrome. It was felt that the patient may benefit from advancement of his blood pressure medications, and lisinopril dose was increased from 5 mg once daily dose to 5 mg twice daily dose. It is recommended to follow the patient's blood pressure readings and make decisions about advancement or decreasing his lisinopril dose, depending upon his needs. It is also recommended to follow the patient's creatinine levels, since he is on the higher doses of ACE inhibitor, which may worsen his kidney function, especially since the patient has, according to his medical history, he has only a solitary kidney. The patient was given IV fluids perioperatively, as mentioned above, and his kidney function remained essentially the same. On the day of admission, the patient's creatinine was 1.43. On the day of discharge with IV fluid administration, his creatinine was 1.39. Again, it is recommended to follow the patient's creatinine level closely with this therapy.   In regards to coronary artery disease, it was felt that the patient should continue his cholesterol-lowering medications. His LDL was found to be in good control, was found to be in the 60s, in fact, it was 67. The patient's total cholesterol was 129. Triglycerides were 155 and HDL was 31. The patient is to continue his usual doses of cholesterol-lowering medication of statin.  He  needs to continue also aspirin, as well as Plavix. However, since the patient was on Prilosec, which is known to have an interaction with Plavix, it was felt that the patient would benefit from discontinuation on Prilosec completely and initiation of Protonix or Nexium for his gastroesophageal reflux disease. For this reason, his Prilosec was changed to Protonix.   In regards to his other chronic medical problems such as COPD, hypothyroidism, glaucoma, depression, schizoaffective disorder, the patient is to continue  his outpatient management.   For his angina, no changes were made. The patient is to continue Imdur, as well as Ranexa.   For diabetes, the patient's blood glucose levels were found to be somewhat low with usual doses of Januvia; however, it was felt to be due to the patient being n.p.o. status for heart catheterization. It is recommended, however, to follow the patient's blood glucose levels very closely in the facility, as well as outpatient and make decisions about discontinuation of Januvia  completely, since hypoglycemia in patients with coronary artery disease can cause significantly more risks.   On the day of discharge, Novermber 19th, 2014, the patient did not complain of any significant discomfort. He had no chest pains. His vital signs were stable with temperature of 97.4, pulse 60, respirations of 16 to 20, blood pressure 158/89, saturation was 100% on room air at rest.   TIME SPENT: 40 minutes.   ____________________________ Katharina Caperima Romel Dumond, MD rv:dmm D: 12/10/2012 18:48:00 ET T: 12/10/2012 21:15:57 ET JOB#: [id number]  cc: Iantha FallenKenneth A. Lady GaryFath, MD Meindert A. Lacie ScottsNiemeyer, MD Katharina CaperIMA Shayda Kalka MD ELECTRONICALLY SIGNED 12/30/2012 15:36

## 2014-05-15 NOTE — Consult Note (Signed)
PATIENT NAME:  Charles Knox, Charles Knox MR#:  [id number] DATE OF BIRTH:  10/17/1955  DATE OF CONSULTATION:  11/13/2013  REFERRING PHYSICIAN:  Altamese Dilling, MD CONSULTING PHYSICIAN:  Charles Blinks, MD  REASON FOR CONSULTATION: Elevated troponin with coronary artery disease, chronic kidney disease, diabetes and heart block.   CHIEF COMPLAINT: "I had chest pain."   HISTORY OF PRESENT ILLNESS: This is a 59 year old male with known coronary artery disease status post coronary artery bypass graft with multiple coronary artery abnormalities in the recent past with chronic kidney disease stage III with a creatinine of 1.6 and a GFR of 47 status post pacemaker placement with heart block with diabetes with complication, COPD, having appropriate medication management for significant coronary artery disease who has had stable angina with physical activity associated with shortness of breath and weakness and fatigue on occasion, but has gotten worse over the last 24 hours with severe substernal chest discomfort radiating into the back, associated with shortness of breath, lasting several hours. At this time, the patient does have paced rhythm by EKG and a troponin of 0.15 consistent with minimal subendocardial myocardial infarction. The patient was placed on appropriate medication in the past including Ranexa, isosorbide, ACE inhibitor, and Plavix for this issue and therefore we have discussed possible conservative therapy.   REVIEW OF SYSTEMS: Negative for vision change, ringing in the ears, hearing loss, cough, congestion, heartburn, nausea, vomiting, diarrhea, bloody stools, stomach pain, extremity pain, leg weakness, cramping of the buttocks, known blood clots, headaches, blackouts, dizzy spells, nosebleeds, congestion, trouble swallowing, frequent urination, urination at night, muscle weakness, numbness, anxiety, depression, skin lesions or skin rashes.  PAST MEDICAL HISTORY: 1.  Coronary artery disease  status post coronary artery bypass graft with angina.  2.  Chronic kidney disease stage III. 3.  Heart block status post pacemaker placement. 4.  Diabetes with complication of cardiovascular disease.  5.  Simple chronic obstructive pulmonary disease.   FAMILY HISTORY: Multiple family members with early onset of cardiovascular disease and hypertension.   SOCIAL HISTORY: The patient currently denies alcohol or tobacco use.   ALLERGIES: As listed.   MEDICATIONS: As listed.   PHYSICAL EXAMINATION: VITAL SIGNS: Blood pressure is 122/68 bilaterally. Heart rate is 70, upright and reclining, and regular.  GENERAL: He is a well appearing male in no acute distress.  HEENT: No icterus, thyromegaly, ulcers, hemorrhage, or xanthelasma.  CARDIOVASCULAR: Regular rate and rhythm. Normal S1 and S2 with a 2/6 apical murmur consistent with mitral regurgitation. PMI is inferiorly and laterally displaced. Carotid upstroke normal without bruit. Jugular venous pressure is normal.  LUNGS: Have few basilar crackles with normal respirations.  ABDOMEN: Soft and nontender without hepatosplenomegaly or masses. Abdominal aorta is normal size without bruit.  EXTREMITIES: 2+ radial, femoral, and dorsal pedal pulses with trace lower extremity edema. No cyanosis, clubbing or ulcers.  NEUROLOGIC: He is oriented to time, place, and person with normal mood and affect.   ASSESSMENT: A 59 year old male with coronary artery disease status post coronary artery bypass graft with angina and new onset subendocardial myocardial infarction or non-ST-elevation myocardial infarction with chronic kidney disease stage III, heart block status post pacemaker placement, diabetes with complication and simple chronic obstructive pulmonary disease needing further treatment options.   RECOMMENDATIONS: 1.  Heparin for 24 to 48 hours for subendocardial myocardial infarction.  2.  Continue aspirin and Plavix for other antiplatelet treatment.  3.   Isosorbide, Ranexa, ACE inhibitor and possible beta blocker if able for further treatment of anginal  equivalent.  4.  Echocardiogram for left ventricular systolic dysfunction and changes needing treatment, including possible diuresis.  5.  Ambulation and follow for improvements of symptoms and if improved would medically manage with the above. Otherwise, possible further intervention including cardiac catheterization depending on extent of continued angina. The patient understands the risks and benefits of cardiac catheterization. This includes the possibility of death, stroke, heart attack, infection, bleeding or blood clot. He is at low risk for conscious sedation. ____________________________ Charles BlinksBruce J. Kowalski, MD bjk:sb D: 11/13/2013 08:21:08 ET T: 11/13/2013 10:40:19 ET JOB#: [id number]  cc: Charles BlinksBruce J. Kowalski, MD, <Dictator> Charles BlinksBRUCE J KOWALSKI MD ELECTRONICALLY SIGNED 11/17/2013 8:41

## 2014-05-15 NOTE — Discharge Summary (Signed)
PATIENT NAME:  Charles Knox, Charles Knox MR#:  [id number] DATE OF BIRTH:  1955/07/06  DISCHARGE DIAGNOSES: 1.  Chronic angina.  2.  Chronic elevation of troponin.  3.  Hypertension.  4.  Diabetes mellitus.  DISCHARGE MEDICATIONS: Please see attached medication reconciliation.  DISCHARGE INSTRUCTIONS: Low-sodium, carbohydrate-controlled diet. Activity as tolerated. Follow up with Dr. Juliann Paresallwood in 1-2 weeks. No exertional activity until seen by cardiologist.   CONSULTATIONS:  Lamar BlinksBruce J. Kowalski, MD, with cardiology.  ADMITTING HISTORY AND PHYSICAL:  Please see detailed H and P dictated previously by Dr. Elisabeth PigeonVachhani. In brief, a 59 year old male patient with past history of CAD, status post CABG, presented to the hospital complaining of chest pain. The patient was found to have mildly elevated troponin of 0.15 which seems to be chronically elevated. Admitted to rule out acute coronary syndrome.   HOSPITAL COURSE: 1.  Chest pain. The patient had 3 sets of cardiac enzymes which were fairly stable, although elevated at 0.15, they did not trend up.  The patient was seen by cardiology who initially thought this could be subendocardial infarction, but on further discussing with Dr. Gwen PoundsKowalski, he thought patient's pain is likely chronic angina which he is optimally treated for at this point including Ranexa.  The patient was thought to be safe to return home and follow up with his cardiologist.  2.  Hypertension, diabetes, fairly controlled during the hospital stay.  Prior to discharge, the patient's lungs sound clear. S1, S2 heard without any murmurs.   TIME SPENT ON DAY OF DISCHARGE IN DISCHARGE ACTIVITY: 41 minutes.   ____________________________ Molinda BailiffSrikar R. Ellias Mcelreath, MD srs:LT D: 11/16/2013 13:16:38 ET T: 11/16/2013 21:43:47 ET JOB#: [id number]  cc: Wardell HeathSrikar R. Trapper Meech, MD, <Dictator> Orie FishermanSRIKAR R Concepcion Gillott MD ELECTRONICALLY SIGNED 11/26/2013 14:51

## 2014-05-15 NOTE — H&P (Signed)
PATIENT NAME:  Charles Knox, Dorrance MR#:  [id number] DATE OF BIRTH:  [birth year]  DATE OF ADMISSION:  11/12/2013  PRIMARY CARE PHYSICIAN: Dr. Lacie ScottsNiemeyer.   PRIMARY CARDIOLOGIST: Dr. Juliann Paresallwood.  CHIEF COMPLAINT: Chest pain.   HISTORY OF PRESENT ILLNESS: This is a 59 year old male who has a past history of coronary artery disease and stent placement, CABG, also has history of pacemaker placement, hypertension, diabetes, COPD, hypothyroidism, glaucoma and schizoaffective disorder, who lives in a group home. Today, he had complaint of chest pain when he was resting and the pain was retrosternal, he said 8 out of 10, which was not related with any movement or breathing. He had some dry cough but no shortness of breath and no fever or chills. The pain was somewhat better after receiving nitroglycerin tablet 2 to 3 times in Emergency Room. Currently still feels some tightness, but not as bad as he did before and so because of a strong positive history, ER physician decided to admit the patient for ruling out coronary artery disease or acute coronary syndrome.   REVIEW OF SYSTEMS: CONSTITUTIONAL: Negative for fever, fatigue, weakness, pain weight loss.  EYES: No blurring, double vision, discharge or redness.  EARS, NOSE, THROAT: No tinnitus, ear pain or hearing loss.  RESPIRATORY: No cough, wheezing, hemoptysis, or shortness of breath.  CARDIOVASCULAR: The patient has some chest pain. No orthopnea, edema, arrhythmia, palpitations.  GASTROINTESTINAL: No nausea, vomiting, abdominal pain.  GENITOURINARY: No dysuria, hematuria, increased frequency.  ENDOCRINE: No heat or cold intolerance. No excessive sweating.  SKIN: No acne, rashes, or lesions.  MUSCULOSKELETAL: No pain or swelling in the joints.  NEUROLOGICAL: No numbness, weakness, tremor or vertigo.  PSYCHIATRIC: No anxiety, insomnia, bipolar disorder.   PAST MEDICAL HISTORY:  1. Coronary artery disease, status post stent placement and bypass graft.   2. History of pacemaker placement.  3. Hypertension.  4. Diabetes.  5. COPD.  6. Hypothyroidism.  7. Gastroesophageal reflux disease. 8. Depression.  9.  schizoaffective disorder.   SOCIAL HISTORY: He was a smoker but quit more than 10 years ago. Does have a 20-pack-year smoking history. Has a history of alcohol abuse. He quit about 15 to 20 years ago. No illegal drug use. Lives in a group home.   FAMILY HISTORY: Both mother and father are diabetic and had a stroke.   HOME MEDICATIONS: Still need to be confirmed by pharmacy technician but as per the record is: 1. Ventolin 1 puff inhalation 2 times a day.  2. Sertraline 100 mg once a day.  3. Ranexa 500 mg oral tablet 2 tablets 2 times a day.  4. Pravastatin 40 mg oral once a day.  5. Pantoprazole 40 mg oral 2 times a day.  6. Nitroglycerin 0.4 mg sublingual tablet 3 times every 5 minutes as needed for chest pain.  7. Montelukast 10 mg oral once a day.  8. Metoprolol 25 mg oral 2 times a day.  9. Lorazepam 0.5 mg oral 2 times a day.  10. Lisinopril 5 mg oral 2 times a day.  11. Levothyroxine 75 mcg once a day. 13. Isosorbide mononitrate 2 tablets once a day.  14. Docusate sodium 100 mg oral 2 times a day.  15. Clopidogrel 75 mg oral once a day  16. Aspirin 325 mg once a day.  17. Advair 1 puff inhalation 2 times a day. 18. Abilify 20 mg oral tablet once a day.   VITALS IN THE ER: Temperature 98.2, pulse 65, respirations 18, blood pressure 103/69, pulse  rate 97% on room air.   PHYSICAL EXAMINATION: GENERAL: The patient is fully alert and oriented to time, place, and person. Does not appear in acute distress.  HEENT: Head and neck atraumatic. Conjunctivae pink. Oral mucosa moist.  NECK: Supple. No JVD.  RESPIRATORY: Bilateral equal and clear air entry.  CARDIOVASCULAR: S1, S2 present, regular. No murmur. Pacemaker present.  ABDOMEN: Soft, nontender. Bowel sounds present.  SKIN: No rashes.  EXTREMITIES: Legs: There is slight  edema present. JOINTS: No swelling or tenderness.  NEUROLOGICAL: Power 5 out of 5. Follows commands. Moves all 4 limbs. No tremor or rigidity.  PSYCHIATRIC: Does not appear in any acute psychiatric illness at this time.   IMPORTANT LABORATORY RESULTS: Glucose 77, BUN 25, creatinine 1.52, sodium 142, potassium is 4, chloride is 108, CO2 is 28. Calcium is 8.5. Troponin 0.15. WBC 4, hemoglobin 12.1, platelet count is 138,000. Chest x-ray, portable, single view, shows no acute finding. EKG is paced rhythm, no ST-T changes.   ASSESSMENT AND PLAN: A 59 year old male with history of coronary artery disease, bypass graft and stent placement and had some blockages as per the last catheterization last year. He came to hospital because of chest pain and pain is a little bit better after receiving nitroglycerin.   1. Angina at rest. Because of the patient's strong coronary artery history of disease, will admit to telemetry and follow serial troponins. Currently, his troponin is at his baseline, which is like 0.1 with some renal failure and having history of coronary artery disease. We will continue his cardiac medication, nitroglycerin, Ranexa, isosorbide mononitrate, his beta blocker and lisinopril, aspirin and Plavix and will get cardiology consult tomorrow with [facility]. His primary care physician is Dr. Juliann Pares.  2. Hypertension. Blood pressure is currently under control so we will just continue home medication, lisinopril.  3. Diabetes. We will hold oral medications and continue insulin sliding scale coverage.  4. Hypothyroidism. Continue levothyroxine.  5. Chronic obstructive pulmonary disease. Currently no acute wheezing. We will just continue his Advair and Montelukast.  CODE STATUS: Full code.   TOTAL TIME SPENT ON THIS ADMISSION: 50 minutes.    ____________________________ Hope Pigeon Elisabeth Pigeon, MD vgv:dw D: 11/12/2013 19:38:12 ET T: 11/12/2013 19:50:37  ET JOB#: [id number]  cc: Hope Pigeon. Elisabeth Pigeon, MD, <Dictator> Dwayne D. Juliann Pares, MD Altamese Dilling MD ELECTRONICALLY SIGNED 11/22/2013 18:21

## 2014-05-16 NOTE — H&P (Signed)
PATIENT NAME:  Charles Knox, Charles Knox MR#:  [id number] DATE OF BIRTH:  [birth year]  DATE OF ADMISSION:  04/14/2011  PRIMARY CARE PHYSICIAN: Dr. Carolynn Comment in [place].   CARDIOLOGIST: Dr. Marney Setting in [place].   HISTORY OF PRESENT ILLNESS:  The patient is a 59 year old Caucasian male with past medical history significant for history of coronary artery disease, status post coronary artery bypass grafting x1 approximately 2 or 3 years ago, history of hyperlipidemia, hypertension, as well as pacemaker placement, diabetes, presented to the hospital with complaints of midsternal chest pains. According to the patient, he was doing well up until the morning of the day of admission. He was awake, lying in the bed, and today in the morning he started having dull 7 out of 10 by intensity midsternal chest pain. The pain lasted approximately 1 to 1-1/2 hours, worsened over a period of time, also was some discomfort in the abdomen radiating to lower back as well as bilateral arms. He denies any significant shortness of breath or diaphoresis. However, he admits of dizziness as well as headaches and achiness all over his body. Because of this discomfort, he decided to come to the Emergency Room for further evaluation. In the Emergency Room, EKG was found to be abnormal with T depressions in V1 as well as V2 and biphasic in V3. He was also noted to have old likely inferior as well as anterolateral infarct, age undetermined; and Hospitalist Services were contacted for admission. His troponin also came back slightly elevated at 0.09. The patient took three nitro stat pills with no significant improvement of his chest pain. He had similar pains before, according to him, and he had his most recent stress test approximately a year ago.   PAST MEDICAL HISTORY:  1. History of  coronary artery disease, coronary artery bypass graft x1 two or three years ago.  2. History of hyperlipidemia.  3. Hypertension. 4. Diabetes mellitus, type 2.   5. Pacemaker placement for low heart rate.  6. Low back pain.  7. History of gastroesophageal reflux disease.  8. History of kidney tumor, status post left nephrectomy.  9. History of glaucoma. 10. Gout. 11. Depression. 12. Schizoaffective disorder.  13. Hypothyroidism, based on medication list.  14. Obstructive sleep apnea for which he uses CPAP at night.  15. Chronic obstructive pulmonary disease, based on medication list.   PAST SURGICAL HISTORY: As above.   ALLERGIES: Penicillin.   MEDICATION LIST:  1. Docusate 100 mg p.o. twice daily. 2. Pravastatin 40 mg p.o. daily.  3. Montelukast 10 mg p.o. daily.  4. Ranexa 500 mg p.o. twice daily. 5. Sertraline 100 mg p.o. daily.  6. Aspirin 325 mg p.o. daily.  7. Abilify 10 mg p.o. daily. 8. Omeprazole 10 mg caplets, 2 capsules daily.  9. Clopidogrel 75 mg p.o. daily. 10. Ventolin HFA 2 puffs three times daily as needed.  11. Advair Diskus 250/50, 1 puff twice daily. 12. Travatan 0.004% eye drops, 1 drop to each eye at bedtime.  13. Nitrostat 0.4 mg sublingually every 5 minutes as needed.  14. Glimepiride 2 mg daily in the morning.  15. Metoprolol 25 mg p.o. twice daily.  16. Isosorbide mononitrate 60 mg p.o. daily.  17. Trazodone 50 mg p.o. daily.  18. Levothyroxine 75 mcg p.o. daily.  19. Multivitamins.    FAMILY HISTORY: Positive for hypertension in the patient's mother, also early coronary artery disease. The patient's grandfather had MI at the age of 58. The patient's parents had strokes, also history of diabetes  in the family. The patient's mother had stomach cancer.   SOCIAL HISTORY: The patient is single. He has no children. He used to smoke 1-1/2 packs per day since 59 years old, quit approximately three years ago. No alcohol abuse. He is unemployed. He is on [employer] as well as Disability.   REVIEW OF SYSTEMS: Positive for pains in the chest as well in the back, also glaucoma, year-round allergies, some snoring  for which he uses CPAP, dyspnea on exertion intermittently as well as chest pains, diarrhea, abdominal epigastric pains intermittently, also dysuria and back pains and cramping in his calf. CONSTITUTIONAL: Otherwise, denies any fevers, chills, fatigue or weakness, weight loss or gain. His weight seems to be stable between 239 and 243. EYES: No blurry vision, double vision, cataracts. ENT: Denies any tinnitus, allergies, epistaxis, sinus pain, dentures, difficulty swallowing. RESPIRATORY: Denies any cough, wheezes, asthma or chronic obstructive pulmonary disease.  CARDIOVASCULAR: Denies orthopnea, edema, arrhythmias, palpitations or syncope. GASTROINTESTINAL: Denies nausea, vomiting, hematemesis, rectal bleeding, change in bowel habits. GENITOURINARY: Admits dysuria. No hematuria, frequency, or incontinence. ENDOCRINOLOGY: Denies any polydipsia, nocturia, thyroid problems, heat or cold intolerance, or thirst. HEMATOLOGICAL: Denies anemia, easy bruising, bleeding, or swollen glands. SKIN: Denies any acne rashes, lesions, or change in moles. MUSCULOSKELETAL: Denies arthritis, cramps, swelling, or gout. NEUROLOGICAL: No numbness, epilepsy, or tremor. PSYCHIATRIC: No anxiety, insomnia, or depression noted.   PHYSICAL EXAMINATION:  VITAL SIGNS: On arrival to the hospital, the patient's temperature is 98.8, pulse 69, respiration rate 17, blood pressure 165/72. Oxygen saturation was found to be 98% on room air.   GENERAL: This is a well-developed, well-nourished, obese Caucasian male in no significant distress, somewhat slumped lying on the stretcher.   HEENT: Pupils are equal and reactive to light. Extraocular movements are intact. No icterus or conjunctivitis. Has normal hearing. No pharyngeal erythema. Mucosa is moist.   NECK: Neck did not reveal any masses. It was supple, nontender. Thyroid is not enlarged. No adenopathy. No JVD or carotid bruits bilaterally. Full range of motion.   LUNGS: Clear to  auscultation, however, markedly diminished breath sounds. No rales, rhonchi, or wheezing. No labored inspirations, increased effort, dullness to percussion, or overt respiratory distress.   CARDIOVASCULAR: S1, S2 appreciated. No murmurs, gallops or rubs noted. Rhythm is regular, distant. PMI not lateralized.   CHEST: Nontender to palpation.   EXTREMITIES: Diminished pedal pulses, 1 to 2+ lower extremity edema bilaterally. No calf tenderness, or cyanosis was noted.   ABDOMEN: Soft, nontender. Bowel sounds are present. No hepatosplenomegaly or masses were noted.   RECTAL: Deferred.   MUSCULOSKELETAL: Able to move all extremities. No cyanosis, degenerative joint disease, or kyphosis.   SKIN: Skin did not reveal any rashes, lesions, erythema, nodularity, or induration. It was warm and dry to palpation.   LYMPH: No adenopathy in the cervical region.   NEUROLOGICAL: Cranial nerves are grossly intact. Sensory is intact. No dysarthria or aphasia. The patient is alert, oriented to time, person, and place, cooperative. The patient's memory is somewhat impaired.   PSYCHIATRIC: No significant confusion, agitation, depression noted.    LABORATORY, DIAGNOSTIC AND RADIOLOGICAL DATA:  Basic metabolic panel revealed BUN and creatinine 19 and 1.50, otherwise unremarkable study. The patient's lipase level was normal at 197. Her liver enzymes were normal.  Cardiac enzymes, CK total 228, CK-MB 3.7. Troponin 0.09. White blood cell count is normal at 3.8, hemoglobin 12.2, platelets 117. Absolute neutrophil count is 2.3.  D-dimer is slightly elevated at 0.46.  EKG showed  sinus rhythm at 67 beats minute, left axis deviation, nonspecific intraventricular block, inferior infarct, age undetermined, also anterolateral infarct, age undetermined. T depressions in V2 as well biphasic in V3. No acute ST-T changes were noted and no EKG to compare with. Chest x-ray, portable single view 04/14/2011, showed no evidence of  pneumonia, cannot exclude lingular atelectasis. No evidence of congestive heart failure. Follow-up PA and lateral would be of value, according to the radiologist.     ASSESSMENT AND PLAN:  1. Unstable angina: Admit the patient to telemetry. Continue him on metoprolol as well as aspirin, nitroglycerin topically and heparin IV. I will get cardiologist involved for further recommendations, questionable cardiac catheterization as the patient had his most recent stress test, according to him, approximately a year ago. We will also get echocardiogram.  2. Hypertension: Continue the patient's medications, metoprolol as well as nitroglycerin topically. Add other medications if needed.  3. Hyperlipidemia: Continue Pravachol. Check a lipid panel in the morning.  4. Hypothyroidism: Continue Synthroid and check TSH.  5. Diabetes mellitus: Continue glimepiride as well as sliding scale insulin. Get hemoglobin A1c.  6. History of lower back pains: Tylenol as needed.  7. Chronic obstructive pulmonary disease: Continue Advair Diskus as well as albuterol as needed.   TIME SPENT: One hour.   ____________________________ Katharina Caperima Maimuna Leaman, MD rv:cbb D: 04/14/2011 08:16:35 ET T: 04/14/2011 10:53:40 ET JOB#: [id number]  cc: Katharina Caperima Alyssha Housh, MD, <Dictator> Dr. Olegario MessierShanks, [place], [place] Dr. Marney SettingLevinsky, Cardiology, [place], [place] Katharina CaperIMA Cinzia Devos MD ELECTRONICALLY SIGNED 04/15/2011 13:43

## 2014-05-16 NOTE — Consult Note (Signed)
    General Aspect 59 yo male with history of cad s/p cabg, sick sinus syndrome with ppm who is now admitted with chest pain. He is a difficult historian. {t has history of sleep apnea hypertensioin and hyperlipidemia. He underwent cardiac cath in march of 2013 which revealed an occluded graft to the lad which was felt to best be treated medically. He has a mild troponin elevation of 0.09. On previous admission his tropinin was also 0.08-0.09. He states he is taking his medicatioins but is not sure what they are. EKG reveals paced rhythm.  He denies chest pain at present.   Physical Exam:   GEN obese, disheveled    HEENT PERRL, hearing intact to voice    NECK supple  No masses    RESP normal resp effort    CARD No murmur    ABD denies tenderness  normal BS  no Adominal Mass    LYMPH negative neck    EXTR negative cyanosis/clubbing, negative edema    SKIN normal to palpation    NEURO cranial nerves intact, motor/sensory function intact    PSYCH A+O to time, place, person   Review of Systems:   Subjective/Chief Complaint chest pain and shortness of breath    General: Fatigue  Weakness    Skin: No Complaints    ENT: No Complaints    Eyes: No Complaints    Neck: No Complaints    Respiratory: Short of breath    Cardiovascular: Chest pain or discomfort    Gastrointestinal: No Complaints    Genitourinary: No Complaints    Vascular: No Complaints    Musculoskeletal: No Complaints    Neurologic: No Complaints    Hematologic: No Complaints    Endocrine: No Complaints    Psychiatric: No Complaints    Review of Systems: All other systems were reviewed and found to be negative    Medications/Allergies Reviewed Medications/Allergies reviewed   EKG:   Interpretation paced rhythm    Penicillin: Hives    Impression 59 yo male with history of cad s/p cabg, history of sick sinus syndrome s/p ppm, who was admitted with shortness of breath and chest pain. Mild  troponin elevatioin. EKG paced with normal capture. Chest pain is somewhat untypical. Tropoinin elvation is simiar to previous admissions. Recent cath was felt to be best treated medically. He states he is compliant with meds.    Plan 1. Continue current meds 2. Continue to rule out for an mi. 3. Would not proceed with invasive evalaution at present given recent cath.   Electronic Signatures: Dalia HeadingFath, Kenneth A (MD)  (Signed 19-May-13 16:34)  Authored: General Aspect/Present Illness, History and Physical Exam, Review of System, EKG , Allergies, Impression/Plan   Last Updated: 19-May-13 16:34 by Dalia HeadingFath, Kenneth A (MD)

## 2014-05-16 NOTE — Consult Note (Signed)
PATIENT NAME:  Charles Knox, Charles Knox MR#:  [id number] DATE OF BIRTH:  Mar 23, 1955  DATE OF CONSULTATION:  04/14/2011  REFERRING PHYSICIAN:  [facility], Dr. Winona Legato  CONSULTING PHYSICIAN:  Dwayne D. Callwood, MD  INDICATION: Angina, coronary artery disease, cardiomyopathy.   HISTORY OF PRESENT ILLNESS: Patient is a 59 year old white male appears to have a learning disability presented with coronary artery disease status post coronary bypass of one graft approximately 2 to 3 years ago, hyperlipidemia, hypertension, permanent pacemaker, diabetes, hyperlipidemia, obesity and smoking who presented with recurrent chest pain while lying down that woke him up from sleep, 7/10, lasting one hour. He had shortness of breath. He had had some dizziness and he finally came to Emergency Room for evaluation. EKG was equivocal with biphasic changes on EKG. Troponin was slightly up so he was admitted for further evaluation and care. Patient admits to significant chest pain and anginal symptoms so patient was finally admitted for further evaluation and care.   REVIEW OF SYSTEMS: Denies blackout spells, syncope. Denies nausea, vomiting. Denies fever, chills, sweats. No weight loss. No weight gain. No hemoptysis, hematemesis. Denies bright red blood per rectum.   PAST MEDICAL HISTORY:  1. Coronary artery disease. 2. Hyperlipidemia. 3. Hypertension. 4. Diabetes. 5. Sick sinus syndrome. 6. Low back pain. 7. Reflux.  8. Obesity.  9. Smoking.  10. Renal tumor on the left. 11. Glaucoma. 12. Gout. 13. Depression.  14. Schizoaffective disorder.  15. Hypothyroidism.  16. Obstructive sleep apnea.  17. Chronic obstructive pulmonary disease.   PAST SURGICAL HISTORY:  1. Coronary artery bypass grafting x1. 2. Left nephrectomy. 3. Permanent pacemaker placement.   ALLERGIES: Penicillin.   MEDICATIONS:  1. Dulcolax 100 mg twice a day.  2. Pravastatin 40 mg a day.  3. Montelukast 10 mg daily.  4. Ranexa 500 mg twice a  day.  5. Sertraline 100 mg daily.  6. Aspirin 325 mg a daily.  7. Abilify 10 mg daily.  8. Omeprazole 20 mg a day.  9. Clopidogril 75 mg a day.  10. Ventolin p.r.n.  11. Advair Diskus 250/50 twice a day.  12. Travatan drops. 13. Nitrostat p.r.n.  14. Glimepiride 2 mg in the morning.  15. Metoprolol 25 mg twice a day.  16. Imdur 60 mg a day.  17. Trazodone 50 mg a day.  18. Levothyroxine 75 mcg a day.  19. Multivitamin   FAMILY HISTORY: Hypertension, coronary artery disease, myocardial infarction, stroke, diabetes, stomach cancer.   SOCIAL HISTORY: Single. No children. Smoker but recently quit. No alcohol abuse. Lives in a group home. Disabled.   PHYSICAL EXAMINATION:  VITAL SIGNS: Blood pressure 165/72, pulse 70, respiratory rate 16, afebrile.   HEENT: Normocephalic, atraumatic. Pupils equal, reactive to light.   NECK: Supple. No jugular venous distention, bruits, adenopathy.   LUNGS: Clear to auscultation and percussion. No significant wheeze, rhonchi, rale.   ABDOMEN: Positive bowel sounds. No significant rebound, guarding, tenderness.   HEART: Regular rate and rhythm. Soft systolic ejection murmur left sternal border. PMI nondisplaced.   EXTREMITY EXAM: Within normal limits. No cyanosis, clubbing, edema.   NEUROLOGIC: Intact.   SKIN: Normal.   LABORATORY, DIAGNOSTIC AND RADIOLOGICAL DATA: BUN 19, creatinine 1.5, lipase 197. LFTs normal. Cardiac enzymes total CK 228, MB 3.7, troponin 0.09. White count 3.8, hemoglobin 12.2, platelet count 117. D-dimer 0.46. EKG: Normal sinus rhythm, nonspecificSTTw changes, anterior fascicular block, left ventricular hypertrophy, nonspecific T wave inversions. Chest x-ray: Cardiomegaly but no significant airspace disease.   ASSESSMENT:  1. Unstable angina.  2. Possible non-Q-wave myocardial infarction. 3. Coronary artery disease. 4. Hypertension. 5. Hyperlipidemia. 6. Hypothyroidism. 7. Diabetes. 8. Chronic obstructive pulmonary  disease. 9. Obstructive sleep apnea.   PLAN: Agree with admit. Rule out for myocardial infarction. Follow cardiac enzymes. Continue telemetry. Follow up EKGs. Echocardiogram to assess overall LV function and wall motion. Continue blood pressure control. Continue diabetes control. CPAP for obstructive sleep apnea. Would recommend cardiac catheterization. Follow up renal insufficiency. His creatinine was 1.6. Hydrate the patient pre-cath and base further evaluation on results of studies.  ____________________________ Bobbie Stackwayne D. Juliann Paresallwood, MD ddc:cms D: 04/16/2011 16:05:00 ET T: 04/16/2011 16:47:47 ET JOB#: [id number]  cc: Dwayne D. Juliann Paresallwood, MD, <Dictator> Alwyn PeaWAYNE D CALLWOOD MD ELECTRONICALLY SIGNED 05/18/2011 15:48

## 2014-05-16 NOTE — H&P (Signed)
PATIENT NAME:  Charles Knox, Charles Knox MR#:  [id number] DATE OF BIRTH:  13-Nov-1955  DATE OF ADMISSION:  06/10/2011  PRIMARY CARE PHYSICIAN: The patient does not recall the name of his local doctor. He had moved from [place] and used to see Dr. Carolynn CommentShanks in [place] and also a cardiologist, Dr. Marney SettingLevinsky, in [place] as well.   CHIEF COMPLAINT: Chest pain.   HISTORY OF PRESENT ILLNESS: Charles Knox is a 59 year old Caucasian male with history of coronary disease status post coronary artery bypass graft about 2 to 3 years ago. The last time admitted to this hospital was in March, two months ago. At that time, he presented with chest pain and unstable angina. He was evaluated by Dr. Juliann Paresallwood and he had an echocardiogram which showed an ejection fraction of 45 to 50%. There was a slight elevation of troponin. He ended up having cardiac catheterization and that revealed occluded graft to LAD. His treatment was decided to be medical management. The patient stated that he was doing fine until last evening when he developed chest pain but that was after walking about three quarters of a mile and after awhile he developed midsternal chest pain. The severity was about 6 to 7 on a scale of 10 described as a pressure-like feeling without associated shortness of breath, nausea, vomiting, or syncope. This required about three sublingual nitroglycerins which showed some improvement. The pain lasted about 10 minutes or so. The group home where the patient resides called EMS and he had further sublingual nitroglycerin, but pain had subsided. He was brought to the emergency department for evaluation. Here his troponin was slightly elevated at 0.09. The patient was admitted for further evaluation and treatment as observation to follow-up on his cardiac enzymes.   REVIEW OF SYSTEMS: CONSTITUTIONAL: Denies any fever. No chills. No fatigue. EYES: No blurring of vision. No double vision. ENT: No hearing impairment. No sore throat. No  dysphagia. CARDIOVASCULAR: Reports chest pain and as above. No shortness of breath. He has peripheral edema. No syncope. RESPIRATORY: No shortness of breath. No cough. No sputum production. No hemoptysis. GASTROINTESTINAL: No abdominal pain. No vomiting. No diarrhea. GENITOURINARY: No dysuria or frequency of urination. MUSCULOSKELETAL: No joint pain or swelling. No muscular pain or swelling. INTEGUMENTARY: No skin rash. No ulcers. NEUROLOGY: No focal weakness. No seizure activity. No headache. PSYCHIATRY: No anxiety but there is a history of depression. ENDOCRINE: No polyuria or polydipsia. No heat or cold intolerance.   PAST MEDICAL HISTORY:  1. Coronary artery disease status post coronary artery bypass graft about 2 to 3 years ago. Recent cardiac catheterization in March of this year showed totally occluded graft to LAD.  2. Hypertension.  3. Hyperlipidemia.  4. Diabetes mellitus type 2. 5. Pacemaker placement for bradycardia. 6. Gastroesophageal reflux disease.  7. History of kidney tumor status post left nephrectomy. 8. Glaucoma. 9. Obstructive sleep apnea syndrome, on CPAP treatment. 10. Gout. 11. Depression. 12. Schizoaffective disorder. 13. Hypothyroidism. 14. Chronic obstructive pulmonary disease.   PAST SURGICAL HISTORY: Coronary artery bypass graft.  SOCIAL HABITS: Ex-chronic smoker. He used to smoke on and off for about 15 to 20 years. Used to smoke 1-1/2 to 2 packs a day. He quit about 3 to 4 years ago. No history of alcohol or other drug abuse.   FAMILY HISTORY: His grandfather died from a heart attack, but that was at an old age. Both parents are alive, but they suffered from strokes.   SOCIAL HISTORY: He is single, never married, lives in a  group home. His last job was either at [employer] or [employer]. Right now he lives on disability based on his psychiatric background.   ADMISSION MEDICATIONS: The patient's list of medications when he was discharged in March includes the  following. However, he tells me that one or two medications were discontinued, but he does not known which ones. He did not bring his medications nor does he recall the names. His list includes the following:  1. Pravachol 40 mg a day. 2. Lisinopril 5 mg a day. 3. Synthroid 75 mcg a day. 4. Isosorbide mononitrate 60 mg once a day. 5. Lopressor 25 mg twice a day.  6. Glimepiride 2 mg once a day.  7. Nitroglycerin sublingual p.r.n.  8. Travatan one drop in each eye once a day. 9. Advair 250/50 one puff twice a day. 10. Ventolin 2 puffs every four hours p.r.n.  11. Plavix 75 mg a day. 12. Aspirin 325 mg a day. 13. Omeprazole 20 mg once a day. 14. Abilify 20 mg a day. 15. Sertraline 100 mg a day.  16. Ranexa 500 mg twice a day.  17. Singulair 10 mg a day. 18. Colace 100 mg twice a day.   ALLERGIES: Penicillin.   PHYSICAL EXAMINATION:   VITAL SIGNS: Blood pressure 138/76, pulse 62, respiratory rate 15, temperature 97, and oxygen saturation 97%.   GENERAL APPEARANCE: Middle-aged male, obese, lying in bed in no acute distress.   HEAD AND NECK EXAMINATION: No pallor. No icterus. No cyanosis.   EARS, NOSE, AND THROAT: Hearing was normal. Nasal mucosa, lips, and tongue were normal.   EYES: Normal iris and conjunctivae. Pupils area bout 5 mm, equal and reactive to light.   NECK: Supple. Trachea at midline. No thyromegaly. No cervical lymphadenopathy. No masses.   HEART: Normal S1 and S2. No S3 or S4. No murmur. No gallop. No carotid bruits.   RESPIRATORY: Normal breathing pattern without use of accessory muscles. No rales. No wheezing.   ABDOMEN: Obese, soft without tenderness. No hepatosplenomegaly. No masses. No hernias.   SKIN: No ulcers. No subcutaneous nodules. The patient has peripheral edema, +2, bipedal, worse on the left than the right.   MUSCULOSKELETAL: No joint swelling. No clubbing.   NEUROLOGIC: Cranial nerves II through XII are intact. No focal motor deficit.    PSYCHIATRIC: The patient is alert and oriented x3. Mood and affect were normal.  LABS/STUDIES: EKG showed atrial pacemaker rhythm at rate of 61 per minute. Right bundle branch block. Left axis deviation. Otherwise unremarkable EKG.  Serum glucose 108, BUN 15, creatinine 1.5, sodium 144, and potassium 3.3. Estimated GFR 49. Potassium 8.1 and albumin 3.3. The rest of the liver function tests were normal. CPK was normal at 201. Troponin is elevated at 0.09 with slight elevation of CK-MB fraction at 4.5. Going through his records in March his troponin series were as follows: 0.09, 0.08, and 0.08. His CBC showed white count of 5000, hemoglobin 11, hematocrit 33, and platelet count 129.   ASSESSMENT:  1. Chest pain associated with mild elevation of troponin. Although going back through his records, the troponin is at the same level as it was in March. This could be related to his mild chronic renal insufficiency, but I am not certain.  2. Documented coronary disease status post CABG. He is status post recent cardiac catheterization in March showing totally occluded graft to LAD.  3. Diabetes mellitus, type II.  4. Systemic hypertension.  5. Atrial pacemaker for treatment of bradycardia. 6. His  other medical problems include hyperlipidemia, hypothyroidism, glaucoma, gout, schizoaffective disorder, obstructive sleep apnea on CPAP treatment, and chronic obstructive pulmonary disease.   PLAN: Admit the patient to telemetry for observation and follow-up on cardiac enzymes. Given his coronary artery disease and the patient had exerted himself for about three quarters of a mile and then his chest pain was about 10 minutes and finally subsided with nitroglycerin this is a quite understandable scenario. Nevertheless, I will consult cardiology to further look about his troponin issue. This is slightly elevated and close to his baseline compared to March admission troponin levels. I will continue antiplatelets for  both aspirin and Plavix and continue anti-anginal treatment with Ranexa. Continue the rest of his home medications including the nitrate and the statin using Pravachol. Continue monitoring blood sugar with coverage with Accu-Cheks and sliding scale. I will place the patient on CPAP treatment at night.   TIME SPENT: Time spent evaluating this patient and reviewing medical records took more than 55 minutes.  ____________________________ Carney Corners. Rudene Re, MD amd:slb D: 06/10/2011 02:16:51 ET T: 06/10/2011 10:05:40 ET JOB#: [id number]  cc: Carney Corners. Rudene Re, MD, <Dictator> Karolee Ohs Dala Dock MD ELECTRONICALLY SIGNED 06/10/2011 22:16

## 2014-05-16 NOTE — Discharge Summary (Signed)
PATIENT NAME:  Charles Knox, Charles Knox MR#:  [id number] DATE OF BIRTH:  1955/07/12  DATE OF ADMISSION:  06/10/2011 DATE OF DISCHARGE:  06/11/2011  PRIMARY CARE PHYSICIAN: Dr. Lacie ScottsNiemeyer  CARDIOLOGIST: Dr. Juliann Paresallwood   FINAL DIAGNOSES: 1. Chest pain with chronic angina and history of coronary artery disease.  2. Chronically elevated troponin.  3. Hypertension.  4. Diabetes with low sugar.  5. Hyperlipidemia.  6. Hypothyroidism.  7. Chronic obstructive pulmonary disease.  8. Tick exposure.  9. Glaucoma.   MEDICATIONS ON DISCHARGE:  1. Colace 100 mg twice a day. 2. Singulair 10 mg at bedtime.  3. Ranexa 500 mg twice a day. 4. Zoloft 100 mg daily.  5. Aspirin 325 mg daily.  6. Abilify 20 mg daily.  7. Omeprazole 20 mg daily.  8. Plavix 75 mg daily.  9. Advair Diskus 250/50, 1 inhalation twice a day. 10. Travatan Z 0.004% ophthalmic solution one drop each eye at night.  11. Nitrostat 0.4 mg sublingual tablet every five minutes as needed.  12. Metoprolol 25 mg twice a day. 13. Levothyroxine 75 mcg daily.  14. Lisinopril 5 mg daily.  15. Isosorbide mononitrate 60 mg extended-release daily.  16. Pravachol 40 mg daily.  17. Ventolin HFA 90 mcg 1 inhalation twice a day.  18. Do not take Amaryl, which is glimepiride.   HOME OXYGEN: No.   DIET: Low sodium, 1800 ADA diet.   ACTIVITY: Activity as tolerated.   FOLLOW UP: Follow up with Dr. Juliann Paresallwood, cardiology, in two weeks. Follow up with Dr. Lacie ScottsNiemeyer in one week.   REASON FOR ADMISSION: Patient was admitted 06/10/2011, discharged 06/11/2011, came in with chest pain.   HISTORY OF PRESENT ILLNESS: 59 year old man with history of coronary artery disease, bypass, was admitted two months ago, had unstable angina, had a cardiac catheterization at that time and deemed medical management. He presented with pain 6 to 7/10 in intensity, pressure-like, lasted 10 minutes or so, had a borderline troponin in the ER at 0.09. Patient was admitted as an  observation. Cardiology consultation was done. Dr. Lady GaryFath was covering for Dr. Juliann Paresallwood. He was started on heparin drip.   LABORATORY, DIAGNOSTIC AND RADIOLOGICAL DATA: Left axis deviation, right bundle branch block, inferior infarct, paced. INR 1.0, troponin 0.09, white blood cell count 5.1, hemoglobin and hematocrit 11.3 and 33.3, platelet count 129, glucose 108, BUN 15, creatinine 1.56, sodium 144, potassium 3.3, chloride 108, CO2 25, calcium 8.1. Liver function tests normal range. Chest x-ray no acute cardiopulmonary disease. Next two troponins 0.09. 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 Blood glucose dropped down to 46.   HOSPITAL COURSE PER PROBLEM LIST:  1. For the patient's chest pain, he does have chronic angina and history of coronary artery disease. He was seen in consultation by Dr. Lady GaryFath. Since medical management was recommended a couple of months ago no aggressive intervention was done. Patient was kept on his usual medications; Ranexa for angina, aspirin, Plavix for heart disease, metoprolol for heart protection.  2. For his chronically elevated troponin, it was just borderline. It was borderline the last time. No intervention done here. Kept on medical management with aspirin, Plavix and metoprolol.  3. Hypertension, blood pressure was variable during the hospital stay. At times it was high, at times it was low. Blood pressure upon discharge 143/93.  4. Diabetes with low sugar. He did have a sugar in the 40s on his usual home Amaryl. This was stopped. Follow up with medical doctor. Sugar upon discharge 87.  5. For his hyperlipidemia, he was kept on Pravachol.  6. For his chronic obstructive pulmonary disease, respiratory status was stable. Continued on his usual medications.  7. For his tick exposure, tick was sent off for identification. Two doses of doxycycline were given for prophylaxis.  8. For his glaucoma, he is on Travatan.  9. Chronic kidney disease. Baseline  creatinine 1.53, GFR 51 that makes him chronic kidney disease stage III.   TIME SPENT ON DISCHARGE: 35 minutes.   ____________________________ Herschell Dimes. Renae Gloss, MD rjw:cms D: 06/12/2011 16:13:04 ET T: 06/13/2011 10:08:37 ET JOB#: [id number]  cc: Herschell Dimes. Renae Gloss, MD, <Dictator> Meindert A. Lacie Scotts, MD Dwayne D. Juliann Pares, MD Salley Scarlet MD ELECTRONICALLY SIGNED 06/14/2011 13:13

## 2014-05-16 NOTE — Consult Note (Signed)
Chief Complaint:   Subjective/Chief Complaint Pt states recurrent sob cp and angina. Mild weaknes.   VITAL SIGNS/ANCILLARY NOTES: **Vital Signs.:   24-Mar-13 11:24   Vital Signs Type Q 4hr   Temperature Temperature (F) 97.6   Celsius 36.4   Temperature Source oral   Pulse Pulse 61   Pulse source per Dinamap   Respirations Respirations 16   Systolic BP Systolic BP 785   Diastolic BP (mmHg) Diastolic BP (mmHg) 82   Mean BP 100   BP Source Dinamap   Pulse Ox % Pulse Ox % 94   Pulse Ox Activity Level  At rest   Oxygen Delivery Room Air/ 21 %  *Intake and Output.:   24-Mar-13 12:10   Grand Totals Intake:  480 Output:      Net:  480 24 Hr.:  -220   Oral Intake      In:  480   Percentage of Meal Eaten  100   Brief Assessment:   Cardiac Regular  murmur present  + LE edema  -- JVD  --Gallop    Respiratory normal resp effort  clear BS    Gastrointestinal Normal    Gastrointestinal details normal Soft  Nontender  Nondistended  No masses palpable   Routine Hem:  24-Mar-13 00:53    WBC (CBC) 3.9   RBC (CBC) 3.60   Hemoglobin (CBC) 11.7   Hematocrit (CBC) 34.3   Platelet Count (CBC) 105   MCV 95   MCH 32.5   MCHC 34.2   RDW 13.7   Neutrophil % 55.3   Lymphocyte % 32.5   Monocyte % 10.4   Eosinophil % 1.5   Basophil % 0.3   Neutrophil # 2.2   Lymphocyte # 1.3   Monocyte # 0.4   Eosinophil # 0.1   Basophil # 0.0  Routine Chem:  24-Mar-13 00:53    Glucose, Serum 134   BUN 18   Creatinine (comp) 1.61   Sodium, Serum 148   Potassium, Serum 3.8   Chloride, Serum 109   CO2, Serum 27   Calcium (Total), Serum 8.3   Osmolality (calc) 298   eGFR (African American) 58   eGFR (Non-African American) 48   Anion Gap 12  Routine Coag:  24-Mar-13 00:53    Activated PTT (APTT) 106.3  Routine Chem:  24-Mar-13 00:53    Cholesterol, Serum 137   Triglycerides, Serum 208   HDL (INHOUSE) 20   VLDL Cholesterol Calculated 42   LDL Cholesterol Calculated 75  Blood Glucose:   24-Mar-13 07:22    POCT Blood Glucose 94    11:27    POCT Blood Glucose 162    16:36    POCT Blood Glucose 127    19:31    POCT Blood Glucose 110   Radiology Results: XRay:    23-Mar-13 05:27, Chest Portable Single View   Chest Portable Single View    REASON FOR EXAM:    cp  COMMENTS:       PROCEDURE: DXR - DXR PORTABLE CHEST SINGLE VIEW  - Apr 14 2011  5:27AM     RESULT: The lungs are reasonably well inflated. I see no focal   infiltrate. The cardiac silhouette is enlarged in the left heart border   is obscured. A permanent pacemaker is in place. The patient has undergone   previous median sternotomy.    IMPRESSION:  I do not see definite evidence of pneumonia. I cannot   exclude lingular atelectasis. There is no  overt evidence of CHF. A   followup PA and lateral chest x-ray would be of value.        Verified By: DAVID A. Martinique, M.D., MD  Cardiology:    23-Mar-13 10:06, Echo Doppler   Echo Doppler    Interpretation Summary    The left ventricle is not well visualized. The left ventricle is   grossly normal size. There is no thrombus. Ejection Fraction =   45-50%. Left ventricular systolic function is mildly reduced. There   is normal left ventricular wall thickness. The left ventricular wall   motion is normal. The right ventricular systolic function is normal.    PatientHeight: 168 cm    PatientWeight: 111 kg    BSA: 2.2 m2    Procedure:    A two-dimensional transthoracic echocardiogram with color flow and   Doppler was performed.    The study was completed  bedside.    TDS with limited views due to body habitus.    Left Ventricle    The left ventricular wall motion is normal.    There is normal left ventricular wall thickness.    Leftventricular systolic function is mildly reduced.    Ejection Fraction = 45-50%.    The left ventricle is not well visualized.    The left ventricle is grossly normal size.    There is no thrombus.    Right Ventricle    The  right ventricular systolic function is normal.    The right ventricle is grossly normal size.    There is normal right ventricular wall thickness.    Atria    The left atrial size is normal.    Right atrial size is normal.    Mitral Valve    The mitral valve leaflets appear thickened, but open well.    There is mild mitral regurgitation.    Tricuspid Valve    The tricuspid valve is not well visualized, but is grossly normal.    There is mild tricuspid regurgitation.    Aortic Valve    The aortic valve is normal in structureand function.    No aortic regurgitation is present.    Pulmonic Valve    The pulmonic valve is not well seen, but is grossly normal.    There is no pulmonic valvular regurgitation.    Great Vessels    The aortic root is not well visualized but is probably normal size.    Pericardium/Pleural    There is no pleural effusion.    No pericardial effusion.    MMode 2D Measurements and Calculations    Ao root diam: 3.1 cm    ACS: 1.9 cm    LA dimension: 3.8 cm    LVOT diam: 2.2 cm    LVLd ap4: 7.5 cm   EDV(MOD-sp4): 122 ml    LVLs ap4: 6.3 cm    ESV(MOD-sp4): 63 ml    EF(MOD-sp4): 48 %    SV(MOD-sp4): 59 ml    Doppler Measurements and Calculations    MV E point: 63 cm/sec    MV A point: 95 cm/sec    MV E/A: 0.66     MV P1/2t max vel: 63 cm/sec    MV P1/2t: 103 msec    MVA(P1/2t): 2.1 cm2    MV dec slope: 179 cm/sec2    MV dec time: 0.34 sec    Ao V2 max: 136 cm/sec    Ao max PG: 7.0 mmHg    Ao V2 mean: 71 cm/sec  Ao mean PG: 2.7 mmHg    Ao V2 VTI: 22 cm    AVA(I,D): 2.8 cm2    AVA(V,D): 2.5 cm2    LV max PG: 3.0 mmHg    LV mean PG: 1.3 mmHg    LV V1 max: 91 cm/sec    LV V1 mean: 51 cm/sec    LV V1 VTI: 17 cm    MR max vel: 505 cm/sec    MR max PG: 102 mmHg    SV(LVOT): 63 ml    PA V2 max: 103 cm/sec    PA max PG: 4.0 mmHg    PA acc time:0.13 sec    TR Max vel: 234 cm/sec    TR Max PG: 21 mmHg    RVSP: 26 mmHg    RAP systole: 5.0  mmHg    PA pr(Accel): 19 mmHg    Reading Physician: Lujean Amel   Sonographer: Jaci Standard  Interpreting Physician:  Lujean Amel,  electronicallysigned on   04-14-2011 12:34:52  Requesting Physician: Lujean Amel   Assessment/Plan:  Invasive Device Daily Assessment of Necessity:   Does the patient currently have any of the following indwelling devices? none   Assessment/Plan:   Assessment IMP Angina CP CAD HTN DM OSA Obesity Hyperlipidemia CRI .    Plan PLAN ASA Tele Bp control CPAP Agree with ECHO Statin therapy Wgt loss and exerise F/U DM Cardiac cath in am for CAD   Electronic Signatures: Lujean Amel D (MD)  (Signed 25-Mar-13 11:11)  Authored: Chief Complaint, VITAL SIGNS/ANCILLARY NOTES, Brief Assessment, Lab Results, Radiology Results, Assessment/Plan   Last Updated: 25-Mar-13 11:11 by Lujean Amel D (MD)

## 2014-05-16 NOTE — Consult Note (Signed)
    General Aspect 59 yo male with history of cad s/p cabg, sick sinus syndrome with ppm who is now admitted with chest pain. He is a difficult historian. {t has history of sleep apnea hypertensioin and hyperlipidemia. He underwent cardiac cath in may of 2013 which revealed an occluded graft to the lad which was felt to best be treated medically. He has a trivial troponin elevation. On previous admission his tropinin was also 0.08-0.09. He states he is taking his medicatioins but is not sure what they are. EKG reveals paced rhythm.  He denies chest pain at present.   Physical Exam:   GEN obese, disheveled    HEENT PERRL, hearing intact to voice    NECK supple  No masses    RESP normal resp effort    CARD No murmur    ABD denies tenderness  normal BS  no Adominal Mass    LYMPH negative neck    EXTR negative cyanosis/clubbing, negative edema    SKIN normal to palpation    NEURO cranial nerves intact, motor/sensory function intact    PSYCH A+O to time, place, person   Review of Systems:   Subjective/Chief Complaint chest pain and shortness of breath    General: Fatigue  Weakness    Skin: No Complaints    ENT: No Complaints    Eyes: No Complaints    Neck: No Complaints    Respiratory: Short of breath    Cardiovascular: Chest pain or discomfort    Gastrointestinal: No Complaints    Genitourinary: No Complaints    Vascular: No Complaints    Musculoskeletal: No Complaints    Neurologic: No Complaints    Hematologic: No Complaints    Endocrine: No Complaints    Psychiatric: No Complaints    Review of Systems: All other systems were reviewed and found to be negative    Medications/Allergies Reviewed Medications/Allergies reviewed   EKG:   Interpretation paced rhythm    Penicillin: Hives    Impression 59 yo male with history of cad s/p cabg, history of sick sinus syndrome s/p ppm, who was admitted with shortness of breath and chest pain. Mild troponin  elevatioin. EKG paced with normal capture. Chest pain is somewhat untypical. Tropoinin elvation is simiar to previous admissions. Recent cath was felt to be best treated medically. He states he is compliant with meds.    Plan 1. Continue current meds 2. Continue to rule out for an mi. 3. Would not proceed with invasive evalaution at present given recent cath.   Electronic Signatures: Dalia HeadingFath, Ronna Herskowitz A (MD)  (Signed 23-Jun-13 20:34)  Authored: General Aspect/Present Illness, History and Physical Exam, Review of System, EKG , Allergies, Impression/Plan   Last Updated: 23-Jun-13 20:34 by Dalia HeadingFath, Torah Pinnock A (MD)

## 2014-05-16 NOTE — Discharge Summary (Signed)
PATIENT NAME:  Charles Knox, Charles Knox MR#:  [id number] DATE OF BIRTH:  Nov 29, 1955  DATE OF ADMISSION:  07/15/2011 DATE OF DISCHARGE:  07/16/2011  For a detailed note, please take a look at the history and physical done on admission by Dr. Gaynelle CageMarcel Akuneme.  DISCHARGE DIAGNOSES:  1. Chest pain likely related to chronic stable angina.  2. Mild troponin elevation due to underlying coronary disease but no acute coronary syndrome. 3. History of coronary artery disease, status post coronary artery bypass graft. 4. History of schizoaffective disorder. 5. Hypertension. 6. Hyperlipidemia. 7. Urinary tract infection.   DIET: The patient is being discharged on a low-sodium, low-fat diet.   ACTIVITY: As tolerated.   DISCHARGE FOLLOWUP: Followup with Dr. Lacie ScottsNiemeyer in the next 1 to 2 weeks. Also followup with Dr. Juliann Paresallwood in the next 1 to 2 weeks.   DISCHARGE MEDICATIONS: 1. Colace 100 mg twice a day. 2. Singulair 10 mg daily.  3. Ranexa 500 mg twice a day. 4. Aspirin 325 mg daily.  5. Abilify 20 mg daily.  6. Omeprazole 20 mg daily.  7. Plavix 75 mg daily.  8. Advair 250/50 one puff twice a day. 9. Travatan eyedrops one drop to each eye daily.  10. Sublingual nitroglycerin as needed.  11. Metoprolol titrate 25 mg twice a day. 12. Synthroid 75 mcg daily.  13. Lisinopril 5 mg daily.  14. Pravachol 40 mg daily.  15. Albuterol inhaler 1 puff twice a day as needed.  16. Zoloft 100 mg daily.  17. Tradjenta 5 mg daily.  18. Imdur 60 mg daily.  19. Ciprofloxacin 500 mg twice a day x5 days.   CONSULTANTS: Harold HedgeKenneth Fath, MD - Cardiology.   PERTINENT STUDIES DURING HOSPITAL COURSE: CT scan of the chest done with contrast showed mild cardiomegaly with no pulmonary embolism.   Chest x-ray showed cardiomegaly and pacemaker placement.   Ultrasound of the right lower extremity showed no evidence of any deep vein thrombosis.   HOSPITAL COURSE: This is a 59 year old male with multiple medical problems as  mentioned above who presented to the hospital with chest pain and mild troponin elevation.  1. Chest pain. Given the patient's previous history of coronary artery disease and CABG, the patient was brought to the hospital and had three sets of cardiac markers checked. He had some mild troponin elevation which was very similar to his previous hospitalization. He also had a recent cardiac catheterization done a few months ago by Dr. Juliann Paresallwood which included LIMA graft to the LAD, but it was not amenable to any acute intervention. Therefore, medical management was recommended. During this hospitalization, the patient was seen by Dr. Lady GaryFath and he did not think that the patient's clinical symptoms or EKG changes were consistent with acute coronary syndrome, but this is likely chronic stable angina. He was maintained on his aspirin, Plavix, metoprolol, lisinopril, statin, and Ranexa, and he is currently being discharged on them presently.  2. Mildly elevated troponin. This was likely secondary to underlying chronic stable angina, but no evidence of any acute coronary syndrome: The patient will resume his home medications as stated.  3. History of schizoaffective disorder. The patient is currently on Abilify and Zoloft. He will resume that.  4. Hypertension. The patient remained hemodynamically stable on his metoprolol, lisinopril, and Imdur. He will resume that.  5. Gastroesophageal reflux disease.  The patient was maintained on his Prilosec and he will resume that.  6. Urinary tract infection. This was incidentally noted on a urinalysis. We will discharge  him on a five day course of ciprofloxacin. 7. Hypothyroidism. The patient was maintained on his Synthroid. He will resume that upon discharge.   DISPOSITION: He is currently being discharged back to his assisted living facility.  TIME SPENT: 40 minutes.  ____________________________ Rolly Pancake. Cherlynn Kaiser, MD vjs:slb D: 07/16/2011 14:48:46 ET T: 07/17/2011  11:23:37 ET JOB#: [id number]  cc: Rolly Pancake. Cherlynn Kaiser, MD, <Dictator> Meindert A. Lacie Scotts, MD Dwayne D. Juliann Pares, MD Houston Siren MD ELECTRONICALLY SIGNED 08/01/2011 12:23

## 2014-05-16 NOTE — H&P (Signed)
PATIENT NAME:  Charles Knox, Charles Knox MR#:  [id number] DATE OF BIRTH:  05-06-1955  DATE OF ADMISSION:  07/15/2011  PRIMARY CARE PHYSICIAN: Dr. Lacie ScottsNiemeyer  ER PHYSICIAN: Dr. Clemens Catholicagsdale  ADMITTING PHYSICIAN: Dr. Tilda FrancoAkuneme  PRESENTING COMPLAINT: Chest pain.   HISTORY OF PRESENT ILLNESS: The patient is a 59 year old man who presented with complaint of chest pain which started suddenly this evening. The patient said that he was walking when he started having shortness of breath, which was proceeded by chest pain. Pain was 7/10 in intensity, sharp in nature, like a weight on his chest. He had some shortness of breath but denies any loss of consciousness. No dizziness. No PND or orthopnea. He has had pedal edema over the past few weeks. Denies any recent change in medication, sick contacts, or long distance travel. No rash on his chest wall. For this he took some nitroglycerin and aspirin, which relieved the pain.  The patient stated that he did not have any abdominal pain. There have been no episodes of chest pain with feeds. Pain was nonradiating. He denies any associated history of trauma.  Of note, he had recently lifted a heavy oxygen tank about two days prior to the onset of the current pain.  The patient claims good compliance to his medications and his CPAP machine. For this he was referred to the hospitalist for further evaluation. Work-up so far includes an EKG which is unchanged from a previous one, normal blood pressure, cardiac enzymes which are elevated- 0.08 first set, second set 0.09, which seems to be his current baseline as that was the same level he had about one month ago when he presented with similar complaints. During that time he was reviewed by cardiologist and was for medical management. Of note, three months ago when he had a cardiac catheterization for unstable angina which showed LAD graft occlusion and medical management was recommended. He is currently pain free.     REVIEW OF SYSTEMS:  CONSTITUTIONAL: Denies any fever. Admits to fatigue. No weight loss or weight gain. EYES: Denies any blurred vision, dizziness, redness, or discharge. ENT: No tinnitus, postnasal drip, epistaxis, difficulty swallowing, or redness of the oropharynx. RESPIRATORY: Denies any cough but admits to shortness of breath during the acute episode of chest pain. CARDIOVASCULAR: Positive for chest pain and pedal edema. Denies palpitations, syncope, or orthopnea. Admits exertional dyspnea. GI: No nausea, vomiting, diarrhea, abdominal pain, or change in bowel habits. GU: Positive for dysuria but no hematuria or polyuria. ENDOCRINE: No polyuria, increased heat or cold intolerance, no excessive thirst. HEMATOLOGIC: No anemia, easy bruising, bleeding, or swollen glands. SKIN: No rashes, change in hair or skin texture. MUSCULOSKELETAL: Admits some generalized joint aches and shoulder aches.  No gout, redness, or limited activity. NEUROLOGIC: Denies any numbness or tremors. No vertigo, headaches, or seizures. PSYCHIATRIC: Denies any anxiety or depression.   PAST MEDICAL HISTORY:  1. Hypothyroidism.  2. Coronary artery disease, status post myocardial infarction, status post recent cardiac catheterization three months ago which showed occlusion of the LAD graft and medical management was recommended. 3. Sick sinus syndrome status post pacemaker placement. 4. Chronic angina. 5. Chronic obstructive pulmonary disease. 6. Glaucoma.  7. History of tick exposure, which was treated with prophylaxis doxycycline.  8. Hyperlipidemia.  9. Type 2 diabetes.  10. Hypertension.  11. Chronic kidney disease stage III with baseline creatinine of 1.5.  12. Gastroesophageal reflux disease.    13. Obstructive sleep apnea, compliant with CPAP machine.  14. History of depression and schizoaffective  disorder.   PAST SURGICAL HISTORY:  1. Cardiac catheterization three months ago for unstable angina.  2. Coronary artery bypass grafting about  three years ago   SOCIAL HISTORY: Reformed smoker, a 20 pack-year history, quit about three years ago. The patient is single, lives in a group home. He denies any recreational drug use.   FAMILY HISTORY: Positive for coronary artery disease in grandfather, CVA in both parents.   ALLERGIES: Penicillin.   MEDICATIONS:  1. Abilify 20 mg daily.  2. Advair Diskus 250/50, 1 puff twice a day.  3. Aspirin 325 mg once daily.  4. Colace 100 mg twice daily.  5. Imdur 60 mg extended release tablet once daily.  6. Synthroid 0.075 mg once a day.  7. Lisinopril 5 mg once a day. 8. Lopressor 25 mg twice a day.  9. Montelukast 10 mg daily in the evening. 10. Nitrostat 0.4 mg sublingual every five minutes p.r.n. chest pain.  11. Omeprazole 20 mg delayed release tablet once daily.  12. Plavix 75 mg daily.  13. Pravachol 40 mg daily.  14. Prilosec 20 mg once daily.  15. Ranexa 500 mg twice a day.  16. Sertraline 100 mg once a day.  17. Tradjenta 5 mg daily.  18. Travatan 0.004% ophthalmic solution once a day to each eye in the evening. 19. Ventolin inhaler 1 puff twice a day. 20. Zoloft 100 mg once a day.   PHYSICAL EXAMINATION:  VITAL SIGNS: Temperature 97.9, pulse 71, respiratory rate 18, blood pressure 141/62, oxygen saturation 100% on room air.   GENERAL: Well built, well nourished, middle aged male lying on the gurney. Awake, alert, oriented to time, place, and person, in no obvious distress.   HEENT: Atraumatic, normocephalic. Pupils equal, reactive to light and accommodation. Extraocular movements intact. Mucous membranes pink, moist.   NECK: Supple. No JV distention.   CHEST: Good air entry. Clear to auscultation.   HEART: Regular rate and rhythm. No murmurs.   ABDOMEN: Full, moves with respiration, nontender. Bowel sounds normoactive. No organomegaly.   EXTREMITIES: Bilateral pitting leg edema, 2+. Nontender. No other deformity noted. Normal range of movement.   NEUROLOGICAL:  Cranial nerves II through XII grossly intact. No focal motor or sensory deficit.   PSYCH: Affect flat.  LABORATORY, DIAGNOSTIC, AND RADIOLOGICAL DATA: EKG shows normal sinus rhythm, rate of 71, right bundle branch block, left anterior fascicular block. T-wave depression in anterior lateral leads unchanged from previous EKG compared to one month ago. Chest x-ray shows cardiomegaly with pacing wires. No acute infiltrates or cardiopulmonary abnormality. CT angiogram shows no PE, but shows mild cardiomegaly.   CBC: White count of 4, hemoglobin 11, platelets 112, down from 119 from one month ago. Chemistry unremarkable with sodium 144, potassium 3.8, creatinine 1.5, which is his baseline. BUN is 16, glucose 94, calcium 7.9 with albumin of 3.4. Normal LFTs. CK is 175. MB fraction 1.9. Troponin was 0.08 first set, second set was 0.09. INR 0.9. PT of 13. Record shows echocardiogram with ejection fraction of 45 to 50%, normal left ventricular wall motion, and normal right ventricular systolic pressure.   IMPRESSION:  1. Chest pain, atypical, to rule out ACS versus arrhythmia.  2. Elevated troponin, likely multifactorial from his chronic kidney disease versus demand ischemia  perfusion mismatch from his coronary artery disease to rule out ACS.  3. History of coronary artery disease, status post CABG, status post recent cardiac cauterization with occlusion of LAD. Medical management recommended.  4. Hypothyroidism, stable on Synthroid.  5. Hypertension, stable.  6. Type 2 diabetes, well controlled.  7. History of obstructive sleep apnea on CPAP machine.  8. History of dysuria and urinary tract infection.   PLAN:  1. Admit to the general medical floor for supportive care for serial cardiac enzymes. Check TSH, magnesium, and fasting lipid profile. Telemonitoring. Consider stress test for further characterization of his chest pain as he is for the second time now in the last two months for similar problem with  marginally elevated troponin. 2. Continue outpatient medications- aspirin, Plavix. 3. Heparin infusion, p.r.n. morphine. 4. Continue CPAP for respiratory support. 5. Send urinalysis to rule out any urinary tract infection. 6. Continue outpatient medications and adjust as needed.  7. Cardiology evaluation in the a.m.  8. GI prophylaxis with Protonix. 9. CODE STATUS: FULL CODE.   TOTAL PATIENT CARE TIME: 50 minutes.   ____________________________ Floy Sabina Tilda Franco, MD mia:bjt D: 07/15/2011 01:05:20 ET T: 07/15/2011 09:41:16 ET JOB#: [id number]  cc: Iden Stripling I. Tilda Franco, MD, <Dictator> Meindert A. Lacie Scotts, MD Margaret Pyle MD ELECTRONICALLY SIGNED 07/17/2011 0:33

## 2014-05-16 NOTE — Discharge Summary (Signed)
PATIENT NAME:  Charles Knox, Charles Knox MR#:  [id number] DATE OF BIRTH:  [birth year]  DATE OF ADMISSION:  04/14/2011 DATE OF DISCHARGE:  04/17/2011  DISCHARGE DIAGNOSES:  1. Unstable angina with chest pain.  2. Coronary artery disease, status post coronary artery bypass graft and stents.  3. Hypertension.  4. Hyperlipidemia.  5. Diabetes. 6. History of pacemaker. 7. Degenerative joint disease. 8. Back pain.  9. Gastroesophageal reflux disease.  10. History of kidney tumor, status post left nephrectomy.  11. Glaucoma. 12. Gout. 13. Depression. 14. Schizoaffective disorder. 15. Hypothyroidism. 16. Obstructive sleep apnea, on continuous positive airway pressure (CPAP).  17. Chronic renal insufficiency. 18. Possibly chronic anemia.  19. Thrombocytopenia.   DISPOSITION: The patient is being discharged to his group home.   DIET: Low sodium, 1800 calorie ADA diet.   ACTIVITY: As tolerated.   OTHER: Continue CPAP at night.   FOLLOWUP: Follow up with Dr. Juliann Paresallwood as scheduled.    DISCHARGE MEDICATIONS:  1. Pravachol 40 mg daily.  2. Lisinopril 5 mg daily.  3. Synthroid 75 mcg daily.  4. Isosorbide dinitrate 60 mg daily.  5. Lopressor 25 mg b.i.d.  6. Glimepiride 2 mg daily.  7. Nitroglycerin p.r.n.  8. Travatan 1 drop to each affected eye once a day in the evening. 9. Advair 250/50, 1 puff b.i.d.  10. Ventolin 2 puffs every 4 hours p.r.n.  11. Plavix 75 mg daily.  12. Omeprazole 20 mg daily.  13. Abilify 20 mg daily.  14. Aspirin 325 mg daily.  15. Sertraline 100 mg daily. 16. Ranexa 50 mg b.i.d.  17. Singulair 10 mg daily.  18. Colace 100 mg b.i.d.   CONSULTATION: Cardiology consultation with Dr. Juliann Paresallwood.   PROCEDURES: Cardiac catheterization which showed anterobasal hypokinesis, moderate antero lateral hypokinesis, ejection fraction 40%. The patient had occluded graft to LAD, and medical therapy was recommended.   LABORATORY, DIAGNOSTIC AND RADIOLOGICAL DATA:  Chest x-ray  showed no acute cardiopulmonary pathology.  CBC normal. Hemoglobin 12.2 to 13.6, platelet count 105 to 134.  Creatinine ranging from 1.50 to 1.61. Cholesterol profile showing LDL 42, elevated triglyceride 208, HDL 20 with LDL 75.  Hemoglobin A1c was 6.0.  Cardiac enzymes 0.08.  TSH normal.   HOSPITAL COURSE: The patient is a 59 year old male with past medical history of coronary artery disease, status post coronary artery bypass graft and stent, who presented with chest pain and unstable angina. He was admitted to the hospital and started on aspirin, beta blocker, statin, nitro paste, and a heparin drip. An echo was done which showed ejection fraction of 45 to 50%. Cardiac enzymes were checked and were very minimally elevated. Given his risk factors, a Cardiology consultation with Dr. Juliann Paresallwood was obtained, and the patient underwent cardiac catheterization which showed occluded graft to LAD. Medical management was recommended. The patient possibly has chronic anemia, thrombocytopenia and renal insufficiency. His fasting lipid profile showed well controlled LDL, but triglycerides and LDL were elevated. Therefore, statin has been added to his treatment regimen. TSH was checked and was normal. Hemoglobin A1c is 6.0. The rest of his medical problems remained stable during the hospitalization. He is being discharged in a stable condition.   TIME SPENT: 45 minutes.   ____________________________ Darrick MeigsSangeeta Nayzeth Altman, MD sp:cbb D: 04/17/2011 16:15:03 ET T: 04/18/2011 10:31:43 ET JOB#: [id number]  cc: Darrick MeigsSangeeta Ettel Albergo, MD, <Dictator> Darrick MeigsSANGEETA Tedric Leeth MD ELECTRONICALLY SIGNED 04/18/2011 17:19

## 2014-05-16 NOTE — Consult Note (Signed)
Chief Complaint:   Subjective/Chief Complaint Pt doing better today still has occ cp. Still sob. Tele ok.   VITAL SIGNS/ANCILLARY NOTES: **Vital Signs.:   24-Mar-13 11:24   Vital Signs Type Q 4hr   Temperature Temperature (F) 97.6   Celsius 36.4   Temperature Source oral   Pulse Pulse 61   Pulse source per Dinamap   Respirations Respirations 16   Systolic BP Systolic BP 299   Diastolic BP (mmHg) Diastolic BP (mmHg) 82   Mean BP 100   BP Source Dinamap   Pulse Ox % Pulse Ox % 94   Pulse Ox Activity Level  At rest   Oxygen Delivery Room Air/ 21 %  *Intake and Output.:   Daily 24-Mar-13 07:00   Grand Totals Intake:  1475 Output:  700    Net:  775 24 Hr.:  775   Oral Intake      In:  480   Heparin      In:  395   IV (Primary)      In:  600   Urine ml     Out:  700   Length of Stay Totals Intake:  1475 Output:  700    Net:  775   Brief Assessment:   Cardiac Regular  murmur present  + LE edema  -- JVD    Respiratory normal resp effort  clear BS    Gastrointestinal Normal    Gastrointestinal details normal Soft  Nontender  Nondistended  No masses palpable  Bowel sounds normal  No rebound tenderness   Routine Hem:  24-Mar-13 00:53    WBC (CBC) 3.9   RBC (CBC) 3.60   Hemoglobin (CBC) 11.7   Hematocrit (CBC) 34.3   Platelet Count (CBC) 105   MCV 95   MCH 32.5   MCHC 34.2   RDW 13.7   Neutrophil % 55.3   Lymphocyte % 32.5   Monocyte % 10.4   Eosinophil % 1.5   Basophil % 0.3   Neutrophil # 2.2   Lymphocyte # 1.3   Monocyte # 0.4   Eosinophil # 0.1   Basophil # 0.0  Routine Chem:  24-Mar-13 00:53    Glucose, Serum 134   BUN 18   Creatinine (comp) 1.61   Sodium, Serum 148   Potassium, Serum 3.8   Chloride, Serum 109   CO2, Serum 27   Calcium (Total), Serum 8.3   Osmolality (calc) 298   eGFR (African American) 58   eGFR (Non-African American) 48   Anion Gap 12  Routine Coag:  24-Mar-13 00:53    Activated PTT (APTT) 106.3  Routine Chem:  24-Mar-13  00:53    Cholesterol, Serum 137   Triglycerides, Serum 208   HDL (INHOUSE) 20   VLDL Cholesterol Calculated 42   LDL Cholesterol Calculated 75  Blood Glucose:  24-Mar-13 07:22    POCT Blood Glucose 94    11:27    POCT Blood Glucose 162   Radiology Results: XRay:    23-Mar-13 05:27, Chest Portable Single View   Chest Portable Single View    REASON FOR EXAM:    cp  COMMENTS:       PROCEDURE: DXR - DXR PORTABLE CHEST SINGLE VIEW  - Apr 14 2011  5:27AM     RESULT: The lungs are reasonably well inflated. I see no focal   infiltrate. The cardiac silhouette is enlarged in the left heart border   is obscured. A permanent pacemaker is in place.  The patient has undergone   previous median sternotomy.    IMPRESSION:  I do not see definite evidence of pneumonia. I cannot   exclude lingular atelectasis. There is no overt evidence of CHF. A   followup PA and lateral chest x-ray would be of value.        Verified By: DAVID A. Martinique, M.D., MD  Cardiology:    23-Mar-13 10:06, Echo Doppler   Echo Doppler    Interpretation Summary    The left ventricle is not well visualized. The left ventricle is   grossly normal size. There is no thrombus. Ejection Fraction =   45-50%. Left ventricular systolic function is mildly reduced. There   is normal left ventricular wall thickness. The left ventricular wall   motion is normal. The right ventricular systolic function is normal.    PatientHeight: 168 cm    PatientWeight: 111 kg    BSA: 2.2 m2    Procedure:    A two-dimensional transthoracic echocardiogram with color flow and   Doppler was performed.    The study was completed  bedside.    TDS with limited views due to body habitus.    Left Ventricle    The left ventricular wall motion is normal.    There is normal left ventricular wall thickness.    Leftventricular systolic function is mildly reduced.    Ejection Fraction = 45-50%.    The left ventricle is not well visualized.    The  left ventricle is grossly normal size.    There is no thrombus.    Right Ventricle    The right ventricular systolic function is normal.    The right ventricle is grossly normal size.    There is normal right ventricular wall thickness.    Atria    The left atrial size is normal.    Right atrial size is normal.    Mitral Valve    The mitral valve leaflets appear thickened, but open well.    There is mild mitral regurgitation.    Tricuspid Valve    The tricuspid valve is not well visualized, but is grossly normal.    There is mild tricuspid regurgitation.    Aortic Valve    The aortic valve is normal in structureand function.    No aortic regurgitation is present.    Pulmonic Valve    The pulmonic valve is not well seen, but is grossly normal.    There is no pulmonic valvular regurgitation.    Great Vessels    The aortic root is not well visualized but is probably normal size.    Pericardium/Pleural    There is no pleural effusion.    No pericardial effusion.    MMode 2D Measurements and Calculations    Ao root diam: 3.1 cm    ACS: 1.9 cm    LA dimension: 3.8 cm    LVOT diam: 2.2 cm    LVLd ap4: 7.5 cm   EDV(MOD-sp4): 122 ml    LVLs ap4: 6.3 cm    ESV(MOD-sp4): 63 ml    EF(MOD-sp4): 48 %    SV(MOD-sp4): 59 ml    Doppler Measurements and Calculations    MV E point: 63 cm/sec    MV A point: 95 cm/sec    MV E/A: 0.66     MV P1/2t max vel: 63 cm/sec    MV P1/2t: 103 msec    MVA(P1/2t): 2.1 cm2    MV dec slope: 179 cm/sec2  MV dec time: 0.34 sec    Ao V2 max: 136 cm/sec    Ao max PG: 7.0 mmHg    Ao V2 mean: 71 cm/sec    Ao mean PG: 2.7 mmHg    Ao V2 VTI: 22 cm    AVA(I,D): 2.8 cm2    AVA(V,D): 2.5 cm2    LV max PG: 3.0 mmHg    LV mean PG: 1.3 mmHg    LV V1 max: 91 cm/sec    LV V1 mean: 51 cm/sec    LV V1 VTI: 17 cm    MR max vel: 505 cm/sec    MR max PG: 102 mmHg    SV(LVOT): 63 ml    PA V2 max: 103 cm/sec    PA max PG: 4.0 mmHg    PA acc time:0.13  sec    TR Max vel: 234 cm/sec    TR Max PG: 21 mmHg    RVSP: 26 mmHg    RAP systole: 5.0 mmHg    PA pr(Accel): 19 mmHg    Reading Physician: Lujean Amel   Sonographer: Jaci Standard  Interpreting Physician:  Lujean Amel,  electronicallysigned on   04-14-2011 12:34:52  Requesting Physician: Lujean Amel   Assessment/Plan:  Invasive Device Daily Assessment of Necessity:   Does the patient currently have any of the following indwelling devices? none   Assessment/Plan:   Assessment IMP [place] NQMI ? CAD  SOB HTN Hyperlipidemia OSA CRI    Plan PLAN Tele EKGs CPAP for OSA Hydration  Cardiac cath In am Bp control Consider nephology for CRI Continue statin therapy   Electronic Signatures: Yolonda Kida (MD)  (Signed [phone] 20:46)  Authored: Chief Complaint, VITAL SIGNS/ANCILLARY NOTES, Brief Assessment, Lab Results, Radiology Results, Assessment/Plan   Last Updated: 24-Mar-13 20:46 by Yolonda Kida (MD)

## 2014-05-23 NOTE — Discharge Summary (Signed)
PATIENT NAME:  Charles Knox, Charles Knox MR#:  [id number] DATE OF BIRTH:  [birth year]  DATE OF ADMISSION:  03/17/2014 DATE OF DISCHARGE:  03/19/2014  DISCHARGE DIAGNOSES: 1.  Chest pain secondary to chronic angina.  2.  Acute on chronic systolic heart failure.  3.  Chronic elevated troponins.  4.  Hypertension.  5.  Schizoaffective disorder.  6.  Coronary artery disease with bypass history.   7.  Hypothyroidism.   DISCHARGE MEDICATIONS: Levothyroxine 75 mcg p.o. daily, metoprolol tartrate 25 mg p.o. b.i.d., Zoloft 100 mg p.o. daily, pravastatin 40 mg daily, Plavix 75 mg p.o. daily, pantoprazole 40 mg p.o. b.i.d., aspirin 325 mg p.o. daily, lisinopril 5 p.o. daily, Ativan 0.5 mg p.o. daily, Singulair 10 mg p.o. daily, Ranexa 1000 mg p.o. b.i.d., Haldol 5 mg once a day at bedtime, Abilify 400 mg IM extended release once a month 400 mg, furosemide 20 mg daily, this is a new medication.    DISCHARGE DIET: Low-fat, low-sodium diet.   DISPOSITION: Discharge back to group home. He is from [facility] group home, we discharged him there.   CONSULTATIONS: Cardiology consultation with Dr. Lady GaryFath.   HOSPITAL COURSE: This is a 59 year old male patient who comes from a group home because of shortness of breath and chest pain. The patient noted to have lower extremity swelling as well. Did not have any dizziness, nausea or syncope. The patient's EKG showed atrial pacer rhythm, but no ST-T changes, but troponins were 0.15. Because of chest pain, shortness of breath, troponins, he was admitted to telemetry for possible non-ST-elevation MI. He is continued on aspirin, beta blockers, statins. The patient had a cardiology consultation with Dr. Lady GaryFath. The patient had a history of CABG and catheterization in 2014 and 2013. The patient's cardiac catheterization in 2014 showed small and thready LIMA to LAD with poor distal runoff with no high-grade  stenosis.. So because of that, he is not felt to be a candidate for PCA or  repeat CABG. He is on Ranexa for chronic angina. The patient was started on Lasix for pedal edema and shortness of breath. His troponins were 1.13 and 0.11. His chest x-ray showed pacemaker rhythm with no consolidation, mild scarring in the left base. Echocardiogram showed EF 45% to 50% with decreased LV systolic function. The patient had apical and subsegmental and septal anterior wall abnormalities, but the results thought to be old. The patient's symptoms improved with the Lasix and chest pain also did not come back. The patient's BNP was 1362 on admission, so after the Lasix the patient felt better. Dr. Lady GaryFath said no further cardiac intervention and okay to discharge from his standpoint. We discharged him back to group home with a small dose of Lasix. The patient has other medical problems of schizoaffective disorder. He is on Haldol, Abilify, and Zoloft. We will continue that. Hypothyroidism; he is on Synthroid 75 mcg p.o. daily, so we continued that. The patient was asymptomatic in this hospital stay.   LABORATORY DATA: EKG: Sinus rhythm, 64 beats. BNP 1362. White count 4.1, hemoglobin is 14.6, hematocrit 42.1, platelets 162,000. Troponin 0.13 and then 0.11. Chest x-ray: No consolidation, effusion or edema. The patient's kidney function on the 26th showed sodium 143, potassium 4.1, chloride 103, bicarbonate 34, BUN 24, creatinine 1.46, glucose 98.   DISCHARGE PHYSICAL EXAMINATION:  DISCHARGE VITAL SIGNS: Temperature 97.6, heart rate 60, blood pressure 124/86, saturation 96% on room air.   GENERAL: Alert, awake, oriented.  CARDIOVASCULAR: S1, S2, regular.  LUNGS: Clear to auscultation. No  wheeze. No rales.  EXTREMITIES: Decreased extremity edema.   The patient went back to group home in stable condition.   TIME SPENT: More than 30 minutes.    ____________________________ Katha Hamming, MD sk:at D: 03/20/2014 07:06:09 ET T: 03/20/2014 12:51:10 ET JOB#: [id number]  cc: Katha Hamming,  MD, <Dictator> Darlin Priestly. Lady Gary, MD  Katha Hamming MD ELECTRONICALLY SIGNED 03/29/2014 15:29

## 2014-05-23 NOTE — Consult Note (Signed)
Brief Consult Note: Diagnosis: atypical chest pain and abnormal troponin.   Patient was seen by consultant.   Recommend further assessment or treatment.   Comments: 59 yo with history of cad s/p cabg. Cath in 2014 and 2013 revealed patent but small and thready lima to lad with poor distal runoff. No high grade disease in remaining vessels. Not felt to be candidate for pci of repeat cabg. Has chornic mild troponin elevation. Discussed with his primary cardiologist. Would continue to attempt medical therapy. Will continue asa, clopidigrel, renexa, nitrates and metropol at current doses and follow. Not candidate for cath at this point.  Full note to follow.  Electronic Signatures: Dalia HeadingFath, Kenneth A (MD)  (Signed 25-Feb-16 16:54)  Authored: Brief Consult Note   Last Updated: 25-Feb-16 16:54 by Dalia HeadingFath, Kenneth A (MD)

## 2014-05-23 NOTE — H&P (Signed)
PATIENT NAME:  Charles Knox, Charles Knox MR#:  [id number] DATE OF BIRTH:  28-Feb-1955  DATE OF ADMISSION:  05/02/2014  REFERRING PHYSICIAN: Sheryl L. Mindi JunkerGottlieb, MD   PRIMARY CARE PHYSICIAN: Meindert A. Lacie ScottsNiemeyer, MD   CARDIOLOGIST: Bobbie Stackwayne D. Callwood, MD   CHIEF COMPLAINT: Altered mental status.  HISTORY OF PRESENT ILLNESS: A 59 year old Caucasian gentleman with history of COPD, non-O2 dependent; coronary artery disease, status post PCI with stent placement as well as CABG; hypertension, essential, who presented with altered mental status. The patient is unable to provide meaningful information given mental status and medical condition. History obtained from the Emergency Department staff. The patient was in normal state of health. He currently resides at a group home. Apparently went out to smoke; had 6 cigarettes. This was followed by him being lethargic when he came back in. They called EMS given his mental status change. He was brought to the hospital for further workup and evaluation. He was noted to be lethargic and somnolent on arrival. Initial workup reveals hypercapnic respiratory failure. He was subsequently intubated without any complications. Once again, the patient unable to provide meaningful information given mental status and medical condition.   PAST MEDICAL HISTORY: Per documentation includes COPD, , non-O2 dependent; hyperlipidemia, unspecified; coronary artery disease, status post PCI with stent placement as well as CABG; history of permanent pacemaker insertion; type 2 diabetes, uncomplicated; hypertension, essential; hypothyroidism, unspecified.   SOCIAL HISTORY: Remote tobacco use. No alcohol or drug use. Currently resides at a group home.  FAMILY HISTORY: Positive for diabetes as well as CVA.   ALLERGIES: PENICILLIN.   HOME MEDICATIONS: Include aspirin 325 mg p.o. daily, lisinopril 5 mg p.o. daily, Ranexa 1000 mg p.o. b.i.d., lorazepam 0.5 mg p.o. daily, sertraline 100 mg p.o. daily,  pravastatin 40 mg p.o. daily, Plavix 75 mg p.o. daily, Abilify 400 mg intramuscular q. monthly, haloperidol 5 mg p.o. at bedtime, metoprolol tartrate 25 mg p.o. b.i.d., Lasix 20 mg p.o. daily, montelukast 10 mg p.o. daily, pantoprazole 40 mg p.o. b.i.d., levothyroxine 75 mcg p.o. daily.   PHYSICAL EXAMINATION:  VITAL SIGNS: Temperature 96.8, heart rate of 61, respirations 20, blood pressure on arrival 72/53, currently 119/70, saturating 89% on room air on arrival, currently saturating 100% on the ventilator. Weight 84.4 kg. BMI 25.3. GENERAL: Critically ill-appearing Caucasian gentleman, currently intubated, sedated.  HEAD: Normocephalic, atraumatic.  EYES: Pupils equal, round, reactive to light. Unable to fully assess extraocular muscles given mental status and medical condition. No scleral icterus.  MOUTH: Moist mucosal membrane. Dentition intact. No abscess noted. EARS, NOSE, THROAT: Clear without exudates. No external lesions.   NECK: Supple. No thyromegaly. No nodules. No JVD.  PULMONARY: Surprisingly clear to auscultation. No wheezes, rales, rhonchi. No use of accessory muscles. Poor respiratory effort, on ventilator.  CHEST: Nontender to palpation.  CARDIOVASCULAR: S1, S2, regular rate and rhythm. No murmurs, rubs, or gallops. Trace to 1+ edema of lower extremities to the shins. Pedal pulses 2+ bilaterally.  GASTROINTESTINAL: Soft, nontender, nondistended. No masses. Positive bowel sounds. No hepatosplenomegaly.  MUSCULOSKELETAL: No swelling, clubbing, or edema. Range of motion full in all extremities passively.  NEUROLOGICAL: Unable to fully assess given the patient's mental status and medical condition, currently intubated, sedated on propofol.  SKIN: No ulceration, lesions, rashes, cyanosis. Skin warm, dry. Turgor intact.  PSYCHIATRIC: Unable to fully assess given the patient's mental status and medical condition, currently intubated, sedated.   LABORATORY DATA: Sodium of 140, potassium  4, chloride 105, bicarbonate 22, BUN 28, creatinine 1.61, glucose 119.  LFTs within normal limits. Troponin 0.03. Urine drug screen negative. WBC 11.2, hemoglobin 13, platelets of 137,000. Urinalysis negative for evidence of infection. ABG performed, pH 7.2, CO2 of 70, O2 of 41, bicarbonate of 27.4. This was on room air.   IMAGING STUDIES: Chest x-ray performed which reveals low lung volumes associated with bibasilar pulmonary opacifications representing atelectasis. CT head performed which reveals no acute intracranial process. Repeat chest x-ray performed postintubation: ET tube 4.6 cm from carina and improved aeration.   ASSESSMENT AND PLAN: A 59 year old Caucasian gentleman with history of chronic obstructive pulmonary disease, coronary artery disease, status post percutaneous coronary intervention and stent placement, as well as  coronary artery bypass grafting presenting with altered mental status.  1.  Acute on chronic respiratory failure with hypercapnia, currently intubated: Continue full ventilator support. Wean FiO2 and PEEP as tolerated. Follow ABGs. Provide DuoNeb treatments as well as Solu-Medrol 60 mg intravenous daily.  2.  Acute kidney injury, likely prerenal in etiology: Intravenous fluid hydration. Follow urine output and renal function.  3.  Hypothyroidism: Continue with Synthroid.  4.  Hypertension, essential: Continue home medications including lisinopril and Lopressor.  5.  Type 2 diabetes, uncomplicated: Hold oral agents. Add insulin sliding scale with every 6 hour Accu-Cheks.  6.  Venous thromboembolism prophylaxis with heparin subcutaneous.   CODE STATUS: The patient is FULL CODE.   CRITICAL CARE TIME SPENT: 45 minutes.    ____________________________ Cletis Athens. Hower, MD dkh:TT D: 05/02/2014 22:33:05 ET T: 05/02/2014 23:03:51 ET JOB#: [id number]  cc: Cletis Athens. Hower, MD, <Dictator> DAVID Synetta Shadow MD ELECTRONICALLY SIGNED 05/04/2014 3:10

## 2014-05-23 NOTE — Discharge Summary (Signed)
PATIENT NAME:  Charles Knox, Charles Knox MR#:  [id number] DATE OF BIRTH:  10/27/1955  DATE OF ADMISSION:  05/02/2014 DATE OF DISCHARGE:  05/06/2014  ADMISSION COMPLAINT:  Altered mental status.   DISCHARGE DIAGNOSES:  1.  Acute respiratory failure with hypercarbia.  2.  Chronic obstructive pulmonary disease exacerbation.  3.  Acute kidney injury, now resolved.  4.  Hypertension.  5.  Hypothyroidism.  6.  Diabetes mellitus type 2.  7.  Fall and weakness.  8.  Coronary artery disease, status post coronary artery bypass graft.   9.  Pacemaker in place.   CONSULTATIONS:  Dr. Erin Fulling.    PROCEDURES:   1.  Chest x-ray April 10 shows low lung volumes with associated bibasilar pulmonary opacities most consistent with atelectasis, no edema.  2.  Chest x-ray April 10 shows endotracheal tube 4.6 meters from the carina, improved lung aeration,  persistent bibasilar atelectasis.  HISTORY OF PRESENT ILLNESS:  This 59 year old gentleman with history of COPD not on home oxygen,  coronary artery disease, status post CABG, and hypertension presents with altered mental status.  He is unable to provide a history.  According to the EMS report, he was in his normal state of health residing at a group home and went outside to smoke cigarettes.  He smoked approximately 6 cigarettes and lethargic afterwards.  He was brought to the hospital for further evaluation. He was found to be lethargic and somnolent and on further evaluation hypercapnic.  He was intubated in the Emergency Room and admitted to the ICU.    HOSPITAL COURSE BY PROBLEM:    1.  Acute respiratory failure with hypercapnia requiring endotracheal intubation:   The patient remained in the ICU on full ventilator support for 24 hours.  He was extubated successfully and   transitioned to the regular floor.  He is treated with the standard COPD regimen of IV steroids, nebulizer treatments, and inhalers.  He did well, and at the time of discharge was not  requiring any supplemental oxygen.  2.  Acute COPD exacerbation:  See the above discussion of respiratory failure for further information.  Exacerbation was caused by exposure to cigarette smoke.  He was advised to quit smoking though he does not seem willing to do this.  He is not requiring supplemental oxygen at the time of discharge.  He is being discharged on Spiriva, prednisone taper, and a combination inhaler.  3.  Acute renal injury:  At the time of presentation, his creatinine was elevated at 1.6.  His baseline is about 1.4 to 1.5:  Creatinine decreased with hydration and he is being discharged at 1.47 with a GFR of 52 giving him stage II chronic kidney disease.  4.  Hypertension:  Blood pressure well controlled during hospitalization on his home regimen.  5.  Hypothyroidism.  TSH not checked during his hospitalization.   No change is made to his Synthroid dosing.  6.  Diabetes mellitus type 2:  His hemoglobin A1C is not checked during this hospitalization. Blood sugars were well controlled on sliding scale insulin.  He is being discharged on his home regimen of diet control, no medications.  Blood sugars are expected to be slightly elevated with steroids.  7.  Fall with weakness:  No focal neurologic defects.  He was evaluated by physical therapy and home health physical therapy was recommended.  He is being discharged with this service.  8  Coronary artery disease, status post CABG:  No chest pain during admission, no acute cardiac  events.  Continue on statin, beta blocker, Plavix and aspirin.   DISCHARGE PHYSICAL EXAMINATION:  VITAL SIGNS:  Temperature 98.7, pulse 66, respirations 18, blood pressure 133/89, oxygenation 95% on room air.   GENERAL:  No acute distress.  CARDIOVASCULAR:  Regular rate and rhythm.  No murmurs, rubs, or gallops.  No peripheral edema.  Peripheral pulses 1+.  RESPIRATORY:  Lungs are clear to auscultation bilaterally with good air movement.   NEUROLOGIC:  Cranial  nerves II through XII grossly intact.  Strength and sensation intact, nonfocal.   PSYCHIATRIC:  He is alert and oriented, slightly anxious but does have fair insight into his clinical condition.   LABORATORY DATA:  Sodium 142, potassium 4.2, chloride 106, bicarbonate 28, BUN 40 creatinine 1.47, glucose 100.  LFTs on admission were normal.  Cardiac enzymes on admission were normal.  UDS on admission was negative for illicit substances.  White blood cells 10.7, hemoglobin 13.5, platelets 135,000.  MCV of  96.  UA on admission negative for signs of infection. ABG on admission showed pH of 7.2, pCO2 of 70, and  pO2 of 41.   DISCHARGE MEDICATIONS:   1.  Metoprolol tartrate 25 mg 1 tablet twice a day.  2.  Clopidogrel 75 mg 1 tablet once a day.  3.  Pantoprazole 40 mg 1 tablet twice a day.  4.  Aspirin 325 mg 1 tablet once a day.  5.  Lisinopril 5 mg 1 tablet once a day.  6.  Haloperidol 5 mg 1 tablet once a day at bedtime.  7.  Furosemide 20 mg 1 tablet once a day.  8.  Imdur 60 mg 1 tablet once a day.  9.  Zoloft 100 mg 1 tablet once a day.  10. Prednisone 20 mg 1 tablet once a day for 3 more days and then stop.  11. Pravastatin 40 mg 1 capsule once a day.  12. Tiotropium 18 mcg 1 capsule once a day.  13. Lomotil mometasone 5 mcg/100 mcg/inhalation 2 puffs inhaled twice a day.  14. Levothyroxine 75 mcg 1 tablet once a day.   CONDITION ON DISCHARGE:  Stable.   DISPOSITION:  Discharge back to group housing with home health, physical therapy, and home health nursing.   DISCHARGE INSTRUCTIONS:  DIET:  Low-sodium, low-fat, low-cholesterol, carbohydrate modified diet.  ACTIVITY:  As tolerated. no restrictions.  TIMEFRAME FOR FOLLOWUP:  Please followup in 1 to 2 weeks with your primary care physician.   TIME SPENT ON DISCHARGE:  45 minutes.    ____________________________ Ena Dawleyatherine P. Clent RidgesWalsh, MD cpw:[id number] D: 05/10/2014 21:40:03 ET T: 05/10/2014 22:18:11 ET JOB#: [id number]  cc: Ena Dawleyatherine P.  Clent RidgesWalsh, MD, <Dictator> Gale JourneyATHERINE P Carrieanne Kleen MD ELECTRONICALLY SIGNED 05/11/2014 15:08

## 2014-05-23 NOTE — H&P (Signed)
PATIENT NAME:  Charles Knox, Charles Knox MR#:  [id number] DATE OF BIRTH:  [birth year]  DATE OF ADMISSION:  03/17/2014  PRIMARY CARE PROVIDER: [name].   CARDIOLOGIST: Dr. Juliann Pares.   CHIEF COMPLAINT: Chest pain, lower extremity swelling.   HISTORY OF PRESENT ILLNESS: A 59 year old Caucasian male patient with history of CAD status post CABG, hypertension, diabetes, COPD, hypothyroidism, schizoaffective disorder, presents from his group home. The patient started complaining of some lower extremity leg pain bilaterally around his thighs along with worsening lower extremity swelling over the past few days. Was brought to the Emergency Room, but 30 minutes prior to presentation he had left-sided chest pain radiating to the left arm. The patient mentions that he used to have chest pains in the past, but has not had any chest pain in the last 6 months. He does not get any chest pain when he ambulates. He does not have any shortness of breath. No lightheadedness, palpitations, or syncope. He mentions that his last stress test was about 2 years back at an outside hospital. He did have a cardiac catheterization in 2014 where aggressive medical management was advised.   PAST MEDICAL HISTORY:  1. CAD status post CABG and PCI.  2. Pacemaker.  3. Hypertension.  4. Diabetes.  5. COPD.   6. Hypothyroidism.  7. GERD.  8. Depression.  9. Schizoaffective disorder.   SOCIAL HISTORY: The patient used to smoke in the past but quit 10 years back. Does not drink any alcohol. No illegal drug use. Lives in a group home. Ambulates on his own.   FAMILY HISTORY: Mother and father had diabetes and strokes.   ALLERGIES: PENICILLIN.   REVIEW OF SYSTEMS:  CONSTITUTIONAL: Complains of generalized fatigue.  EYES: No blurred vision, pain, or redness.  EARS, NOSE, AND THROAT: No tinnitus, ear pain, hearing loss.  RESPIRATORY: No cough, wheeze, hemoptysis.  CARDIOVASCULAR: Had chest pain.  GASTROINTESTINAL: No nausea,  vomiting, diarrhea, abdominal pain.   GENITOURINARY:  No dysuria, hematuria, or frequency.  ENDOCRINE: No polyuria, nocturia, thyroid problems.  HEMATOLOGIC AND LYMPHATIC: No anemia, easy bruising or bleeding.  INTEGUMENTARY: No acne, rash, lesion.  MUSCULOSKELETAL: Has some thigh pain bilaterally.   NEUROLOGIC: No focal numbness, weakness.  PSYCHIATRIC:  No anxiety.  Has schizoaffective disorder.   HOME MEDICATIONS:  1. Lisinopril 5 mg daily.  2. Ranexa 1000 mg b.i.d.  3. Aspirin 325 mg daily.  4. Plavix 75 mg daily.  5. Abilify 400 mg IM every month.  6. Haloperidol 5 mg daily at bedtime.  7. Levothyroxine 75 mcg a day.  8. Lorazepam 0.5 mg oral once a day.  9. Metoprolol tartrate 25 mg oral 2 times a day.  10. Singulair 10 mg once a day.  11. Protonix 40 mg 2 times a day.  12. Pravastatin 40 mg oral once a day.  13. Sertraline 100 mg daily.      PHYSICAL EXAMINATION:  VITAL SIGNS: Temperature 98.3, pulse of 62, blood pressure 123/71, saturating 99% on room air.  GENERAL: Moderately built Caucasian male patient lying in bed, overall seems comfortable, conversational.  PSYCHIATRIC: Alert, awake, pleasant.  HEENT: Atraumatic, normocephalic.  Oral mucosa moist and pink. External ears and nose normal. No pallor. No icterus. Pupils bilaterally equal and reactive to light.  NECK: Supple. No thyromegaly. No palpable lymph nodes. Trachea midline. No carotid bruit or JVD.  CARDIOVASCULAR: S1, S2, without any murmurs. Peripheral pulses 2 +. No edema.  RESPIRATORY: Normal work of breathing. Clear to auscultation on both sides.  GASTROINTESTINAL: Soft abdomen, nontender. Bowel sounds present. No hepatosplenomegaly palpable.  GENITOURINARY:  No CVA tenderness or bladder distention.  SKIN: Warm and dry. No petechiae, rash, ulcers.  MUSCULOSKELETAL: No joint swelling, redness, effusion of the large joints. Normal muscle tone.  NEUROLOGICAL: Motor strength 5 out of 5 in upper and lower  extremities. Sensation is intact all over.  LYMPHATIC: No cervical lymphadenopathy.   LABORATORY STUDIES: Shows glucose of 79. BNP of 1300. BUN 23, creatinine 1.35, sodium 142, potassium 4.1, chloride 108, GFR of 58. Troponin 0.13. WBC 4.1, hemoglobin 14.6, platelets of 162,000.   EKG shows an atrial paced rhythm.   Chest x-ray shows left-sided scarring, nothing acute.   ASSESSMENT AND PLAN:  1.  Acute chest pain radiating to left arm in a patient with prior coronary artery disease, coronary artery bypass graft. Troponin is 0.15, but the patient has had chronic elevation in troponin. It is unclear if this is his chronic angina which has returned, or this could be non-ST segment elevation myocardial infarction.  At this point we will admit the patient onto telemetry floor. His pain is resolved at this point. We will consult cardiology for further input. The patient will be continued on his aspirin, Plavix, statin, and beta blocker. Check 2 more sets of troponin. We will defer echocardiogram and need for cardiac catheterization to Dr. Juliann Paresallwood.  2.  Lower extremity swelling likely from his chronic systolic congestive heart failure. The patient's last known ejection fraction was 35-40%. We will start him on Lasix 40 mg IV daily and closely monitor his creatinine. The patient will need Lasix at discharge.  3.  Hypertension. Continue medications.  4.  Chronic kidney disease stage III, stable.  5.  Schizoaffective disorder. Continue his home medications.  6.  Deep vein thrombosis prophylaxis with Lovenox.   CODE STATUS: Full code.   TIME SPENT ON THIS CASE: 45 minutes.     ____________________________ Molinda BailiffSrikar R. Dvaughn Fickle, MD srs:bu D: 03/17/2014 16:52:29 ET T: 03/17/2014 17:18:39 ET JOB#: [id number]  cc: Wardell HeathSrikar R. Semaje Kinker, MD, <Dictator> Meindert A. Lacie ScottsNiemeyer, MD Dwayne D. Juliann Paresallwood, MD Orie FishermanSRIKAR R Jumaane Weatherford MD ELECTRONICALLY SIGNED 04/06/2014 8:27

## 2014-07-19 ENCOUNTER — Ambulatory Visit: Payer: Self-pay

## 2015-03-02 IMAGING — CR DG CHEST 1V PORT
1 series · 1 of 1 positions shown · non-contrast
Comparison: 03/28/2013.

CLINICAL DATA: Acute chest pain.

EXAM:
PORTABLE CHEST - 1 VIEW

[ap]
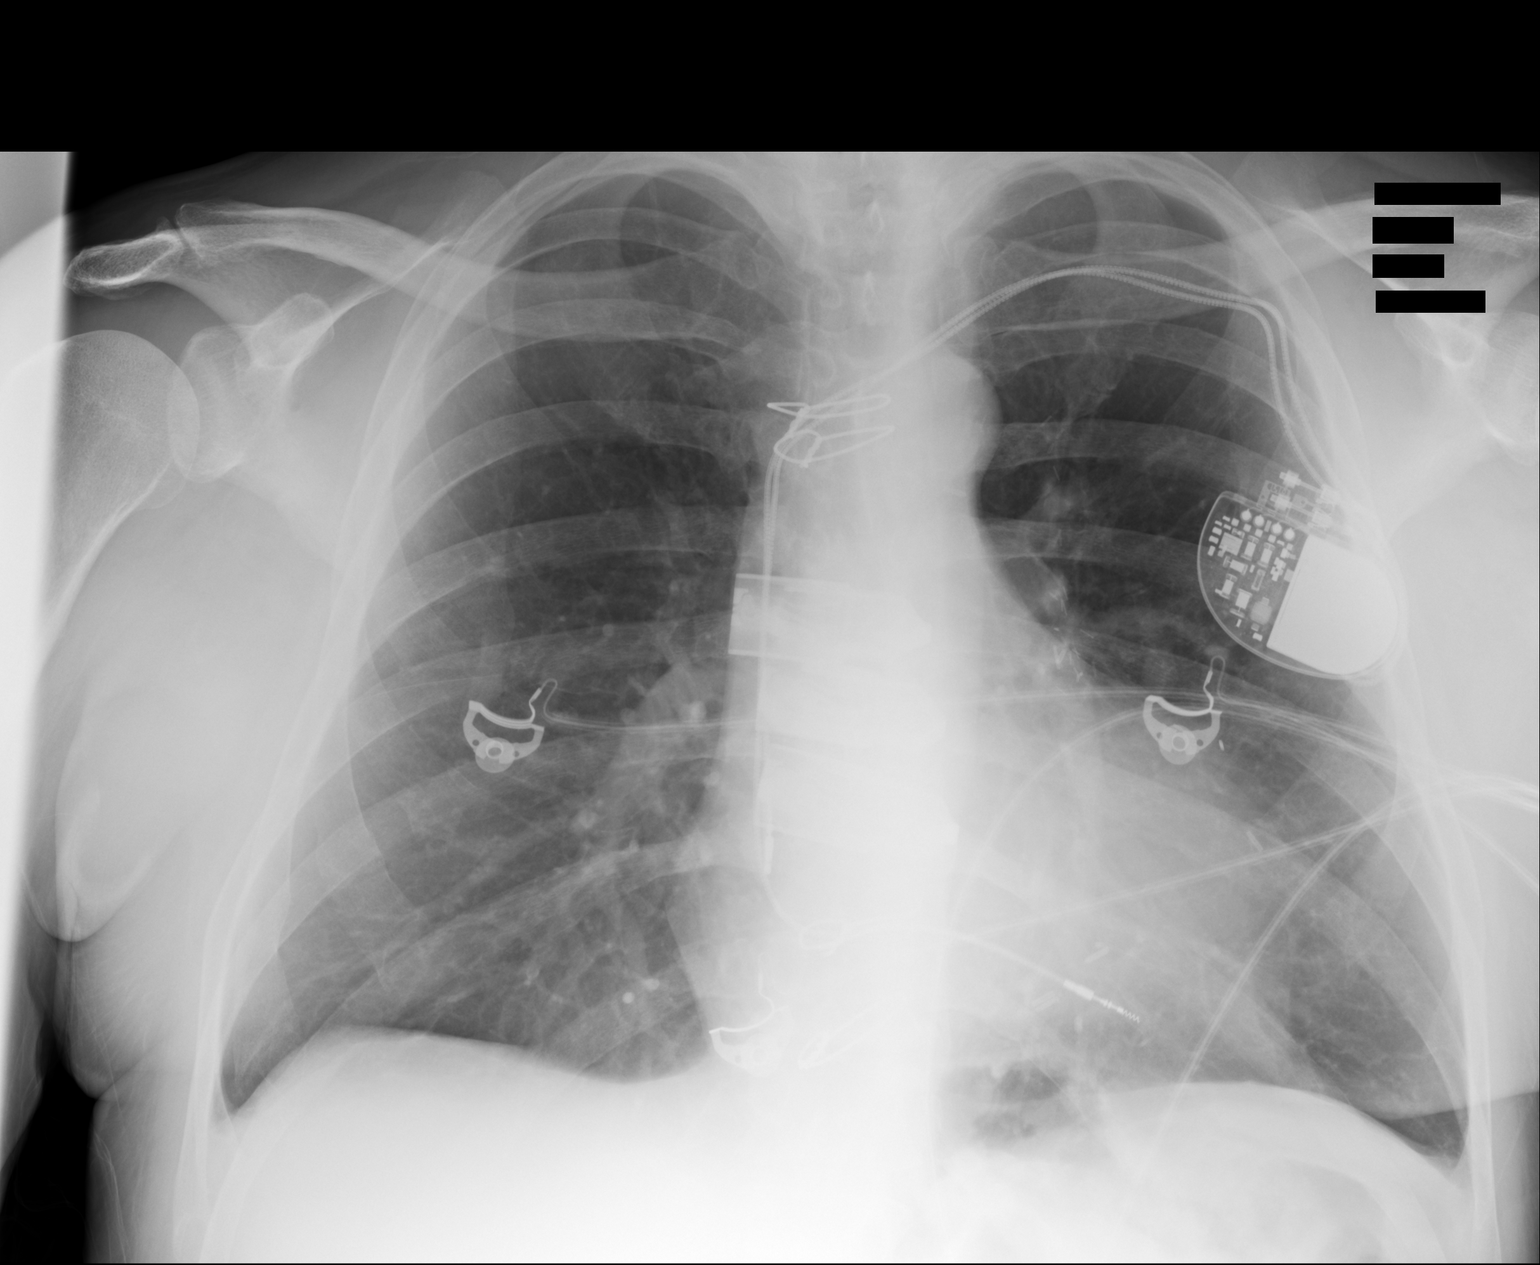

[1 of 1 positions shown; findings below may reference images not displayed]

FINDINGS: Trachea is midline. Heart size stable. Left subclavian pacemaker
lead tips object over the right atrium and right ventricle. Lungs
are clear. No pleural fluid. Median sternotomy changes are noted.
IMPRESSION: No acute findings.

## 2016-03-30 ENCOUNTER — Ambulatory Visit (INDEPENDENT_AMBULATORY_CARE_PROVIDER_SITE_OTHER): Payer: Medicare Other | Admitting: Sports Medicine

## 2016-03-30 ENCOUNTER — Encounter: Payer: Self-pay | Admitting: Sports Medicine

## 2016-03-30 VITALS — BP 117/67 | HR 63

## 2016-03-30 DIAGNOSIS — M79676 Pain in unspecified toe(s): Secondary | ICD-10-CM

## 2016-03-30 DIAGNOSIS — M2142 Flat foot [pes planus] (acquired), left foot: Secondary | ICD-10-CM

## 2016-03-30 DIAGNOSIS — E1142 Type 2 diabetes mellitus with diabetic polyneuropathy: Secondary | ICD-10-CM

## 2016-03-30 DIAGNOSIS — B351 Tinea unguium: Secondary | ICD-10-CM

## 2016-03-30 DIAGNOSIS — M2141 Flat foot [pes planus] (acquired), right foot: Secondary | ICD-10-CM

## 2016-03-30 NOTE — Progress Notes (Signed)
   Subjective:    Patient ID: Charles Knox, male    DOB: 08/31/1955, 61 y.o.   MRN: [id number]  HPI    Review of Systems  All other systems reviewed and are negative.      Objective:   Physical Exam        Assessment & Plan:

## 2016-03-30 NOTE — Progress Notes (Signed)
Subjective: Charles Knox is a 61 y.o. male patient with history of diabetes who presents to office today complaining of long, painful nails  while ambulating in shoes; unable to trim. Patient states that the glucose reading this morning was 104 mg/dl. Patient denies any new changes in medication or new problems. Patient denies any new cramping, numbness, burning or tingling in the legs.  Patient desires diabetic shoes and is assisted by Advice workerfacility manager.  Review of systems per nurse's note.  There are no active problems to display for this patient.  No current outpatient prescriptions on file prior to visit.   No current facility-administered medications on file prior to visit.    Allergies  Allergen Reactions  . Penicillins Other (See Comments)    MAKES HIM FEEL DRUNK    No results found for this or any previous visit (from the past 2160 hour(s)).  Objective: General: Patient is awake, alert, and oriented x 3 and in no acute distress.  Integument: Skin is warm, dry and supple bilateral. Nails are tender, long, thickened and  dystrophic with subungual debris, consistent with onychomycosis, 1-5 bilateral. No signs of infection. No open lesions or preulcerative lesions present bilateral. Remaining integument unremarkable.  Vasculature:  Dorsalis Pedis pulse 1/4 bilateral. Posterior Tibial pulse  2/4 bilateral.  Capillary fill time <3 sec 1-5 bilateral. Positive hair growth to the level of the digits. Temperature gradient within normal limits. No varicosities present bilateral. No edema present bilateral.   Neurology: The patient has intact sensation measured with a 5.07/10g Semmes Weinstein Monofilament at all pedal sites bilateral . Vibratory sensation diminished bilateral with tuning fork. No Babinski sign present bilateral.   Musculoskeletal: Asymptomatic pes planus pedal deformities noted bilateral. Muscular strength 5/5 in all lower extremity muscular groups bilateral without  pain on range of motion. No tenderness with calf compression bilateral.  Assessment and Plan: Problem List Items Addressed This Visit    None    Visit Diagnoses    Dermatophytosis of nail    -  Primary   Pes planus of both feet       Diabetic polyneuropathy associated with type 2 diabetes mellitus (HCC)          -Examined patient. -Discussed and educated patient on diabetic foot care, especially with  regards to the vascular, neurological and musculoskeletal systems.  -Stressed the importance of good glycemic control and the detriment of not  controlling glucose levels in relation to the foot. -Mechanically debrided all nails 1-5 bilateral using sterile nail nipper and filed with dremel without incident  -Safe step diabetic shoe order form was completed; office to contact primary care for approval / certification;  Office to arrange shoe fitting and dispensing. -Answered all patient questions -Otherwise, Patient to return  in 3 months for at risk foot Nail care care -Patient advised to call the office if any problems or questions arise in the meantime.  Asencion Islamitorya Kortni Hasten, DPM

## 2016-07-06 ENCOUNTER — Ambulatory Visit: Payer: Medicaid Other | Admitting: Sports Medicine

## 2016-07-26 ENCOUNTER — Ambulatory Visit (INDEPENDENT_AMBULATORY_CARE_PROVIDER_SITE_OTHER): Payer: Medicare Other | Admitting: Sports Medicine

## 2016-07-26 DIAGNOSIS — M79671 Pain in right foot: Secondary | ICD-10-CM | POA: Diagnosis not present

## 2016-07-26 DIAGNOSIS — E1142 Type 2 diabetes mellitus with diabetic polyneuropathy: Secondary | ICD-10-CM | POA: Diagnosis not present

## 2016-07-26 DIAGNOSIS — B351 Tinea unguium: Secondary | ICD-10-CM

## 2016-07-26 DIAGNOSIS — M79672 Pain in left foot: Secondary | ICD-10-CM

## 2016-07-26 DIAGNOSIS — M2141 Flat foot [pes planus] (acquired), right foot: Secondary | ICD-10-CM

## 2016-07-26 DIAGNOSIS — M2142 Flat foot [pes planus] (acquired), left foot: Secondary | ICD-10-CM

## 2016-07-26 NOTE — Progress Notes (Signed)
Subjective: Charles Knox is a 61 y.o. male patient with history of diabetes who presents to office today complaining of long, painful nails  while ambulating in shoes; unable to trim. Patient states that the glucose reading this morning was not recorded and last time checked was good. Patient denies any other issues.   Patient is assisted by facility manager.  There are no active problems to display for this patient.  Current Outpatient Prescriptions on File Prior to Visit  Medication Sig Dispense Refill  . albuterol (PROVENTIL HFA;VENTOLIN HFA) 108 (90 Base) MCG/ACT inhaler Inhale into the lungs.    . ARIPiprazole (ABILIFY) 9.75 MG/1.3ML injection Inject into the muscle.    Marland Kitchen aspirin EC 325 MG tablet Take by mouth.    . clopidogrel (PLAVIX) 75 MG tablet Take by mouth.    . docusate sodium (COLACE) 100 MG capsule Take 100 mg by mouth.    . furosemide (LASIX) 20 MG tablet Take by mouth.    . haloperidol (HALDOL) 0.5 MG tablet Take by mouth.    . isosorbide mononitrate (IMDUR) 60 MG 24 hr tablet Take 60 mg by mouth.    . levothyroxine (SYNTHROID, LEVOTHROID) 75 MCG tablet Take by mouth.    Marland Kitchen lisinopril (PRINIVIL,ZESTRIL) 5 MG tablet Take by mouth.    Marland Kitchen LORazepam (ATIVAN) 0.5 MG tablet Take by mouth.    . metoprolol tartrate (LOPRESSOR) 25 MG tablet Take by mouth.    . montelukast (SINGULAIR) 10 MG tablet Take by mouth.    . nitroGLYCERIN (NITROSTAT) 0.4 MG SL tablet Place 0.4 mg under the tongue.    . pantoprazole (PROTONIX) 40 MG tablet Take by mouth.    . pravastatin (PRAVACHOL) 40 MG tablet Take by mouth.    . ranolazine (RANEXA) 500 MG 12 hr tablet Take by mouth.    . sertraline (ZOLOFT) 100 MG tablet Take 1/2 tablets by mouth once a day    . sitaGLIPtin (JANUVIA) 50 MG tablet Take 50 mg by mouth.    . Travoprost, BAK Free, (TRAVATAN Z) 0.004 % SOLN ophthalmic solution 1 drop daily.    Marland Kitchen UNABLE TO FIND Inhale into the lungs.     No current facility-administered medications on file  prior to visit.    Allergies  Allergen Reactions  . Penicillins Other (See Comments)    MAKES HIM FEEL DRUNK    No results found for this or any previous visit (from the past 2160 hour(s)).  Objective: General: Patient is awake, alert, and oriented x 3 and in no acute distress.  Integument: Skin is warm, dry and supple bilateral. Nails are tender, long, thickened and  dystrophic with subungual debris, consistent with onychomycosis, 1-5 bilateral. No signs of infection. No open lesions or preulcerative lesions present bilateral. Remaining integument unremarkable.  Vasculature:  Dorsalis Pedis pulse 1/4 bilateral. Posterior Tibial pulse  2/4 bilateral.  Capillary fill time <3 sec 1-5 bilateral. Positive hair growth to the level of the digits. Temperature gradient within normal limits. No varicosities present bilateral. No edema present bilateral.   Neurology: The patient has intact sensation measured with a 5.07/10g Semmes Weinstein Monofilament at all pedal sites bilateral . Vibratory sensation diminished bilateral with tuning fork. No Babinski sign present bilateral.   Musculoskeletal: Asymptomatic pes planus pedal deformities noted bilateral. Muscular strength 5/5 in all lower extremity muscular groups bilateral without pain on range of motion. No tenderness with calf compression bilateral.  Assessment and Plan: Problem List Items Addressed This Visit    None  Visit Diagnoses    Dermatophytosis of nail    -  Primary   Pes planus of both feet       Diabetic polyneuropathy associated with type 2 diabetes mellitus (HCC)       Foot pain, bilateral          -Examined patient. -Discussed and educated patient on diabetic foot care, especially with  regards to the vascular, neurological and musculoskeletal systems.  -Stressed the importance of good glycemic control and the detriment of not  controlling glucose levels in relation to the foot. -Mechanically debrided all nails 1-5  bilateral using sterile nail nipper and filed with dremel without incident  -[employer]af[employer] Diabetic shoes paperwork was resubmitted under his medicare and suppliments -Patient to return  in 3 months for at risk foot Nail care care and when called to be measured for diabetic shoes -Patient advised to call the office if any problems or questions arise in the meantime.  Asencion Islamitorya Jamie Belger, DPM

## 2016-09-21 DIAGNOSIS — I491 Atrial premature depolarization: Secondary | ICD-10-CM

## 2016-09-21 DIAGNOSIS — Z45018 Encounter for adjustment and management of other part of cardiac pacemaker: Secondary | ICD-10-CM

## 2016-09-21 DIAGNOSIS — I495 Sick sinus syndrome: Secondary | ICD-10-CM

## 2016-09-21 HISTORY — DX: Sick sinus syndrome: I49.5

## 2016-09-21 HISTORY — DX: Atrial premature depolarization: I49.1

## 2016-09-21 HISTORY — DX: Encounter for adjustment and management of other part of cardiac pacemaker: Z45.018

## 2016-10-17 DIAGNOSIS — F79 Unspecified intellectual disabilities: Secondary | ICD-10-CM | POA: Insufficient documentation

## 2016-10-25 ENCOUNTER — Encounter (INDEPENDENT_AMBULATORY_CARE_PROVIDER_SITE_OTHER): Payer: Self-pay

## 2016-10-25 ENCOUNTER — Ambulatory Visit (INDEPENDENT_AMBULATORY_CARE_PROVIDER_SITE_OTHER): Payer: Medicare Other | Admitting: Sports Medicine

## 2016-10-25 DIAGNOSIS — M79671 Pain in right foot: Secondary | ICD-10-CM

## 2016-10-25 DIAGNOSIS — M2141 Flat foot [pes planus] (acquired), right foot: Secondary | ICD-10-CM

## 2016-10-25 DIAGNOSIS — E1142 Type 2 diabetes mellitus with diabetic polyneuropathy: Secondary | ICD-10-CM | POA: Diagnosis not present

## 2016-10-25 DIAGNOSIS — M2142 Flat foot [pes planus] (acquired), left foot: Secondary | ICD-10-CM

## 2016-10-25 DIAGNOSIS — M79672 Pain in left foot: Secondary | ICD-10-CM

## 2016-10-25 DIAGNOSIS — B351 Tinea unguium: Secondary | ICD-10-CM

## 2016-10-25 NOTE — Progress Notes (Signed)
Subjective: Jeray Shugart is a 61 y.o. male patient with history of diabetes who presents to office today complaining of long, painful nails  while ambulating in shoes; unable to trim. Patient states that the glucose reading this morning was not recorded and last time checked was 97. Patient denies any other issues.   Patient is assisted by facility manager.  There are no active problems to display for this patient.  Current Outpatient Prescriptions on File Prior to Visit  Medication Sig Dispense Refill  . albuterol (PROVENTIL HFA;VENTOLIN HFA) 108 (90 Base) MCG/ACT inhaler Inhale into the lungs.    . ARIPiprazole (ABILIFY) 9.75 MG/1.3ML injection Inject into the muscle.    Marland Kitchen aspirin EC 325 MG tablet Take by mouth.    . clopidogrel (PLAVIX) 75 MG tablet Take by mouth.    . docusate sodium (COLACE) 100 MG capsule Take 100 mg by mouth.    . furosemide (LASIX) 20 MG tablet Take by mouth.    . haloperidol (HALDOL) 0.5 MG tablet Take by mouth.    . isosorbide mononitrate (IMDUR) 60 MG 24 hr tablet Take 60 mg by mouth.    . levothyroxine (SYNTHROID, LEVOTHROID) 75 MCG tablet Take by mouth.    Marland Kitchen lisinopril (PRINIVIL,ZESTRIL) 5 MG tablet Take by mouth.    Marland Kitchen LORazepam (ATIVAN) 0.5 MG tablet Take by mouth.    . metoprolol tartrate (LOPRESSOR) 25 MG tablet Take by mouth.    . montelukast (SINGULAIR) 10 MG tablet Take by mouth.    . nitroGLYCERIN (NITROSTAT) 0.4 MG SL tablet Place 0.4 mg under the tongue.    . pantoprazole (PROTONIX) 40 MG tablet Take by mouth.    . pravastatin (PRAVACHOL) 40 MG tablet Take by mouth.    . ranolazine (RANEXA) 500 MG 12 hr tablet Take by mouth.    . sertraline (ZOLOFT) 100 MG tablet Take 1/2 tablets by mouth once a day    . sitaGLIPtin (JANUVIA) 50 MG tablet Take 50 mg by mouth.    . Travoprost, BAK Free, (TRAVATAN Z) 0.004 % SOLN ophthalmic solution 1 drop daily.    Marland Kitchen UNABLE TO FIND Inhale into the lungs.     No current facility-administered medications on file  prior to visit.    Allergies  Allergen Reactions  . Penicillins Other (See Comments)    MAKES HIM FEEL DRUNK    No results found for this or any previous visit (from the past 2160 hour(s)).  Objective: General: Patient is awake, alert, and oriented x 3 and in no acute distress.  Integument: Skin is warm, dry and supple bilateral. Nails are tender, long, thickened and  dystrophic with subungual debris, consistent with onychomycosis, 1-5 bilateral. No signs of infection. No open lesions or preulcerative lesions present bilateral. Remaining integument unremarkable.  Vasculature:  Dorsalis Pedis pulse 1/4 bilateral. Posterior Tibial pulse  2/4 bilateral.  Capillary fill time <3 sec 1-5 bilateral. Positive hair growth to the level of the digits. Temperature gradient within normal limits. No varicosities present bilateral. No edema present bilateral.   Neurology: The patient has intact sensation measured with a 5.07/10g Semmes Weinstein Monofilament at all pedal sites bilateral . Vibratory sensation diminished bilateral with tuning fork. No Babinski sign present bilateral.   Musculoskeletal: Asymptomatic pes planus pedal deformities noted bilateral. Muscular strength 5/5 in all lower extremity muscular groups bilateral without pain on range of motion. No tenderness with calf compression bilateral.  Assessment and Plan: Problem List Items Addressed This Visit    None  Visit Diagnoses    Dermatophytosis of nail    -  Primary   Pes planus of both feet       Diabetic polyneuropathy associated with type 2 diabetes mellitus (HCC)       Foot pain, bilateral         -Examined patient. -Discussed and educated patient on diabetic foot care, especially with  regards to the vascular, neurological and musculoskeletal systems.  -Stressed the importance of good glycemic control and the detriment of not  controlling glucose levels in relation to the foot. -Mechanically debrided all nails 1-5  bilateral using sterile nail nipper and filed with dremel without incident  -Awaiting diabetic shoes fitting/measurement -Patient to return  in 3 months for at risk foot Nail care care and for diabetic shoe measurement -Patient advised to call the office if any problems or questions arise in the meantime.  Asencion Islam, DPM

## 2016-11-08 ENCOUNTER — Other Ambulatory Visit: Payer: Medicare Other

## 2016-11-14 DIAGNOSIS — E870 Hyperosmolality and hypernatremia: Secondary | ICD-10-CM

## 2016-11-14 DIAGNOSIS — R7989 Other specified abnormal findings of blood chemistry: Secondary | ICD-10-CM

## 2016-11-14 DIAGNOSIS — F209 Schizophrenia, unspecified: Secondary | ICD-10-CM

## 2016-11-14 DIAGNOSIS — I509 Heart failure, unspecified: Secondary | ICD-10-CM

## 2016-11-14 DIAGNOSIS — I2510 Atherosclerotic heart disease of native coronary artery without angina pectoris: Secondary | ICD-10-CM

## 2016-11-14 DIAGNOSIS — R0902 Hypoxemia: Secondary | ICD-10-CM

## 2016-11-14 DIAGNOSIS — R0603 Acute respiratory distress: Secondary | ICD-10-CM

## 2016-11-15 DIAGNOSIS — R0602 Shortness of breath: Secondary | ICD-10-CM

## 2016-11-22 DIAGNOSIS — E039 Hypothyroidism, unspecified: Secondary | ICD-10-CM

## 2016-11-22 DIAGNOSIS — G4733 Obstructive sleep apnea (adult) (pediatric): Secondary | ICD-10-CM | POA: Insufficient documentation

## 2016-11-22 DIAGNOSIS — I110 Hypertensive heart disease with heart failure: Secondary | ICD-10-CM | POA: Insufficient documentation

## 2016-11-22 DIAGNOSIS — I200 Unstable angina: Secondary | ICD-10-CM

## 2016-11-22 DIAGNOSIS — I495 Sick sinus syndrome: Secondary | ICD-10-CM

## 2016-11-22 DIAGNOSIS — R0602 Shortness of breath: Secondary | ICD-10-CM

## 2016-11-22 DIAGNOSIS — J45909 Unspecified asthma, uncomplicated: Secondary | ICD-10-CM

## 2016-11-22 DIAGNOSIS — I25119 Atherosclerotic heart disease of native coronary artery with unspecified angina pectoris: Secondary | ICD-10-CM | POA: Insufficient documentation

## 2016-11-22 DIAGNOSIS — I209 Angina pectoris, unspecified: Secondary | ICD-10-CM

## 2016-11-22 DIAGNOSIS — IMO0002 Reserved for concepts with insufficient information to code with codable children: Secondary | ICD-10-CM

## 2016-11-22 DIAGNOSIS — J449 Chronic obstructive pulmonary disease, unspecified: Secondary | ICD-10-CM | POA: Insufficient documentation

## 2016-11-22 DIAGNOSIS — Q600 Renal agenesis, unilateral: Secondary | ICD-10-CM | POA: Insufficient documentation

## 2016-11-22 DIAGNOSIS — I10 Essential (primary) hypertension: Secondary | ICD-10-CM

## 2016-11-22 DIAGNOSIS — E7800 Pure hypercholesterolemia, unspecified: Secondary | ICD-10-CM

## 2016-11-22 DIAGNOSIS — I2510 Atherosclerotic heart disease of native coronary artery without angina pectoris: Secondary | ICD-10-CM

## 2016-11-22 DIAGNOSIS — R079 Chest pain, unspecified: Secondary | ICD-10-CM

## 2016-11-22 DIAGNOSIS — E139 Other specified diabetes mellitus without complications: Secondary | ICD-10-CM | POA: Insufficient documentation

## 2016-11-22 HISTORY — DX: Unstable angina: I20.0

## 2016-11-22 HISTORY — DX: Other specified diabetes mellitus without complications: E13.9

## 2016-11-22 HISTORY — DX: Unspecified asthma, uncomplicated: J45.909

## 2016-11-22 HISTORY — DX: Shortness of breath: R06.02

## 2016-11-22 HISTORY — DX: Chest pain, unspecified: R07.9

## 2016-11-22 HISTORY — DX: Sick sinus syndrome: I49.5

## 2016-11-22 HISTORY — DX: Angina pectoris, unspecified: I20.9

## 2016-11-22 HISTORY — DX: Atherosclerotic heart disease of native coronary artery without angina pectoris: I25.10

## 2016-11-22 HISTORY — DX: Reserved for concepts with insufficient information to code with codable children: IMO0002

## 2016-11-22 HISTORY — DX: Obstructive sleep apnea (adult) (pediatric): G47.33

## 2016-11-22 HISTORY — DX: Chronic obstructive pulmonary disease, unspecified: J44.9

## 2016-11-22 HISTORY — DX: Essential (primary) hypertension: I10

## 2016-11-22 HISTORY — DX: Pure hypercholesterolemia, unspecified: E78.00

## 2016-11-22 HISTORY — DX: Hypothyroidism, unspecified: E03.9

## 2016-12-04 NOTE — Progress Notes (Signed)
Cardiology Office Note:    Date:  12/05/2016   ID:  Charles Knox, DOB 30-Dec-1955, MRN [id number]  PCP:  Shelbie Ammons, MD  Cardiologist:  Norman Herrlich, MD    Referring MD: Shelbie Ammons, MD    ASSESSMENT:    1. Hypertensive heart disease with heart failure (HCC)   2. Chronic diastolic heart failure (HCC)   3. Chronic kidney disease, unspecified CKD stage   4. Coronary artery disease involving native coronary artery of native heart with angina pectoris (HCC)   5. Chronic obstructive pulmonary disease, unspecified COPD type (HCC)    PLAN:    In order of problems listed above:  1. He is decompensated fluid overloaded will switch to torsemide and is agreed to sodium restrict. He remains volume overloaded all be forced to give him Zaroxolyn when necessary because of the inability to restrict sodium in his diet. Hypertension is stable continue current medications 2. See discussion above his diet is a barrier to care 3. Stable recent labs reviewed from [facility] I asked him to recheck a BMP and BNP. 4. Stable continue current medical treatment I don't think he requires an ischemia evaluation at this time 5. Stable managed by pulmonary and his PCP 6. Pacemaker is stable we interrogated his device is approximately 5 months of generator life before ERI and will recheck our office when he seen in follow-up make arrangements for enrolling in device EP clinic in my practice   Next appointment: 6 weeks   Medication Adjustments/Labs and Tests Ordered: Current medicines are reviewed at length with the patient today.  Concerns regarding medicines are outlined above.  Orders Placed This Encounter  Procedures  . Basic Metabolic Panel (BMET)  . B Nat Peptide  . Ambulatory referral to Cardiac Electrophysiology   Meds ordered this encounter  Medications  . torsemide (DEMADEX) 20 MG tablet    Sig: Take 1 tablet (20 mg total) 2 (two) times daily by mouth.    Dispense:  180 tablet      Refill:  3    Chief Complaint  Patient presents with  . Hospitalization Follow-up    History of Present Illness:    Charles Knox is a 61 y.o. male with a hx of COPD and prvevious respiratory failure and mechanical ventilation CAD CABG 2013, EF 44% in 2016 hypertension hyperlipidemia and [employer] pacemaker with recent admission t,o [facility] with congestive heart failure last seen 6 months ago. Last MPI at [facility] 2016 without ischemia, global hypokinesia and EF 44%.He was admitted to [facility] with respiratory distress and treated for heart failure. His last pacemaker check described close to [facility] 10/19/16 at [facility]. Compliance with diet, lifestyle and medications: Yes The aid from his group home is present. Unfortunately the diet cannot be sodium restricted and he had salt to his diet. Despite increasing his diuretic he is increasingly edematous and finds himself short of breath ambulating indoors has edema but no orthopnea or PND. He also has sleep apnea but presently is not treated. Medications are supervised he is compliant no chest pain syncope TIA or claudication Past Medical History:  Diagnosis Date  . Agitation 12/29/2013  . Angina pectoris (HCC) 11/22/2016  . Asthma 11/22/2016  . Chest pain, unspecified 11/22/2016  . COPD (chronic obstructive pulmonary disease) (HCC) 11/22/2016  . Coronary disease 11/22/2016   Overview:  CABG 2013  . 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 Hypercholesterolemia 11/22/2016  . Hypertension 11/22/2016  . Hypothyroid 11/22/2016  .  Obstructive sleep apnea 11/22/2016  . PAC (premature atrial contraction) 09/21/2016  . Pacemaker reprogramming/check 09/21/2016  . Schizoaffective disorder (HCC) 12/28/2013  . Sick sinus syndrome (HCC) 11/22/2016  . SOB (shortness of breath) 11/22/2016  . 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  . SSS (sick sinus syndrome) (HCC) 09/21/2016  . Unstable angina (HCC) 11/22/2016    Past Surgical History:   Procedure Laterality Date  . CARDIAC PACEMAKER PLACEMENT    . NEPHRECTOMY      Current Medications: Current Meds  Medication Sig  . ARIPiprazole ER (ABILIFY MAINTENA) 400 MG PRSY Inject one (1) unit into the muscle every 4 weeks  . aspirin EC 81 MG tablet Take 81 mg daily by mouth.  Marland Kitchen. atorvastatin (LIPITOR) 40 MG tablet Take 40 mg daily by mouth.  . budesonide-formoterol (SYMBICORT) 160-4.5 MCG/ACT inhaler Inhale 2 puffs 2 (two) times daily into the lungs.  . clopidogrel (PLAVIX) 75 MG tablet Take by mouth.  . furosemide (LASIX) 40 MG tablet Take 40 mg daily by mouth.  . haloperidol (HALDOL) 5 MG tablet Take 5 mg every morning by mouth. Take 10 mg at bedtime  . ibuprofen (ADVIL,MOTRIN) 800 MG tablet Take 800 mg every 6 (six) hours as needed by mouth for moderate pain.  Marland Kitchen. ipratropium-albuterol (DUONEB) 0.5-2.5 (3) MG/3ML SOLN Take 3 mLs every 6 (six) hours as needed by nebulization.  . isosorbide mononitrate (IMDUR) 60 MG 24 hr tablet Take 60 mg by mouth.  . levothyroxine (SYNTHROID, LEVOTHROID) 75 MCG tablet Take by mouth.  Marland Kitchen. lisinopril (PRINIVIL,ZESTRIL) 5 MG tablet Take by mouth.  . loratadine (CLARITIN) 10 MG tablet Take 10 mg daily by mouth.  . metFORMIN (GLUCOPHAGE) 500 MG tablet Take 2 (two) times daily with a meal by mouth.  . metoprolol tartrate (LOPRESSOR) 25 MG tablet Take 25 mg 2 (two) times daily by mouth.   . mirabegron ER (MYRBETRIQ) 50 MG TB24 tablet Take 50 mg daily by mouth.  . nitroGLYCERIN (NITROSTAT) 0.4 MG SL tablet Place 0.4 mg under the tongue.  Marland Kitchen. olopatadine (PATANOL) 0.1 % ophthalmic solution Place 1 drop daily into both eyes.  Marland Kitchen. omega-3 acid ethyl esters (LOVAZA) 1 g capsule Take 4 (four) times daily by mouth.  . pantoprazole (PROTONIX) 40 MG tablet Take 40 mg 2 (two) times daily by mouth.   . potassium chloride (K-DUR,KLOR-CON) 10 MEQ tablet Take 10 mEq daily by mouth.  . pravastatin (PRAVACHOL) 40 MG tablet Take by mouth.  . sertraline (ZOLOFT) 100 MG  tablet Take 1 tablet by mouth every morning  . tamsulosin (FLOMAX) 0.4 MG CAPS capsule Take 0.4 mg daily by mouth.  . torsemide (DEMADEX) 20 MG tablet Take 1 tablet (20 mg total) 2 (two) times daily by mouth.  . Vitamin D, Ergocalciferol, (DRISDOL) 50000 units CAPS capsule Take 50,000 Units every 7 (seven) days by mouth.     Allergies:   Penicillins   Social History   Socioeconomic History  . Marital status: Single    Spouse name: None  . Number of children: None  . Years of education: None  . Highest education level: None  Social Needs  . Financial resource strain: None  . Food insecurity - worry: None  . Food insecurity - inability: None  . Transportation needs - medical: None  . Transportation needs - non-medical: None  Occupational History  . None  Tobacco Use  . Smoking status: Current Some Day Smoker  . Smokeless tobacco: Never Used  Substance and Sexual  Activity  . Alcohol use: No  . Drug use: No  . Sexual activity: None  Other Topics Concern  . None  Social History Narrative  . None     Family History: The patient's family history is not on file. ROS:   Please see the history of present illness.    All other systems reviewed and are negative.  EKGs/Labs/Other Studies Reviewed:   Pacemaker interrogated, he is rarely ventricularly paced 1.5% of the time and has estimated 5 months to ERI. The following studies were reviewed today: CTA chest, small pleural effusion, no PE , lung nodule noted EKG:  EKG ordered today.  The ekg ordered today demonstrates  [facility] bifasicular heart block RBBB and LAHB Echo  EF 55% with apical hypokinesia, mild to moderate MR , " impaired relaxation" but no description of filling pressures Recent Labs: 11/21/16 Cr 1.8, K 4.0, Hgb 13.9, troponin was not elevated, no record of BNP No results found for requested labs within last 8760 hours.  Recent Lipid Panel    Component Value Date/Time   CHOL 129 12/10/2012 0115   TRIG 155  12/10/2012 0115   HDL 31 (L) 12/10/2012 0115   VLDL 31 12/10/2012 0115   LDLCALC 67 12/10/2012 0115    Physical Exam:    VS:  BP (!) 84/60 (BP Location: Left Arm, Patient Position: Sitting, Cuff Size: Normal)   Pulse 68   Ht 5\' 5"  (1.651 m)   Wt 225 lb (102.1 kg)   SpO2 96%   BMI 37.44 kg/m     Wt Readings from Last 3 Encounters:  12/05/16 225 lb (102.1 kg)     GEN: He appears chronically ill and has a very sallow complexion  in no acute distress HEENT: Normal NECK: No JVD; No carotid bruits LYMPHATICS: No lymphadenopathy CARDIAC: RRR, no murmurs, rubs, gallops RESPIRATORY:  Clear to auscultation without rales, wheezing or rhonchi  ABDOMEN: Soft, non-tender, non-distended MUSCULOSKELETAL:  2-3+ bilateral pitting to the knee edema; No deformity  SKIN: Warm and dry NEUROLOGIC:  Alert and oriented x 3 PSYCHIATRIC:  Normal affect    Signed, Norman HerrlichBrian Munley, MD  12/05/2016 1:44 PM    [facility]

## 2016-12-05 ENCOUNTER — Encounter: Payer: Self-pay | Admitting: Cardiology

## 2016-12-05 ENCOUNTER — Ambulatory Visit (INDEPENDENT_AMBULATORY_CARE_PROVIDER_SITE_OTHER): Payer: Medicare Other | Admitting: Cardiology

## 2016-12-05 VITALS — BP 84/60 | HR 68 | Ht 65.0 in | Wt 225.0 lb

## 2016-12-05 DIAGNOSIS — I5032 Chronic diastolic (congestive) heart failure: Secondary | ICD-10-CM

## 2016-12-05 DIAGNOSIS — J449 Chronic obstructive pulmonary disease, unspecified: Secondary | ICD-10-CM | POA: Diagnosis not present

## 2016-12-05 DIAGNOSIS — N189 Chronic kidney disease, unspecified: Secondary | ICD-10-CM | POA: Diagnosis not present

## 2016-12-05 DIAGNOSIS — I110 Hypertensive heart disease with heart failure: Secondary | ICD-10-CM | POA: Diagnosis not present

## 2016-12-05 DIAGNOSIS — I25119 Atherosclerotic heart disease of native coronary artery with unspecified angina pectoris: Secondary | ICD-10-CM

## 2016-12-05 MED ORDER — TORSEMIDE 20 MG PO TABS: 20.0000 mg | ORAL_TABLET | Freq: Two times a day (BID) | ORAL | 3 refills | Status: DC

## 2016-12-05 NOTE — Patient Instructions (Signed)
Medication Instructions:  Your physician has recommended you make the following change in your medication:  STOP furosemide START torsemide 20 mg twice daily  Labwork: Your physician recommends that you return for lab work in: today. BNP, BMP  Testing/Procedures: None  Follow-Up: Your physician recommends that you schedule a follow-up appointment in: 6 weeks.   You will receive a phone call to schedule with Dr. Graciela Hus[place]Klein in [place].  Any Other Special Instructions Will Be Listed Below (If Applicable).     If you need a refill on your cardiac medications before your next appointment, please call your pharmacy.

## 2016-12-06 ENCOUNTER — Ambulatory Visit: Payer: Medicare Other | Admitting: *Deleted

## 2016-12-06 DIAGNOSIS — M2141 Flat foot [pes planus] (acquired), right foot: Secondary | ICD-10-CM

## 2016-12-06 DIAGNOSIS — E1142 Type 2 diabetes mellitus with diabetic polyneuropathy: Secondary | ICD-10-CM

## 2016-12-06 DIAGNOSIS — M2142 Flat foot [pes planus] (acquired), left foot: Secondary | ICD-10-CM

## 2016-12-07 NOTE — Progress Notes (Signed)
Patient ID: Charles Knox, male   DOB: [birth year], 61 y.o.   MRN: [id number]   Patient presents to be measured for diabetic shoes and inserts with Betha CPed.  Patient measure as 9 wide on the brannock device and has chosen the [employer]pex A5000M Athletic bungee.  We will call when shoes and inserts arrive

## 2016-12-10 LAB — BASIC METABOLIC PANEL
BUN/Creatinine Ratio: 20 (ref 10–24)
BUN: 32 mg/dL — AB (ref 8–27)
CALCIUM: 8.7 mg/dL (ref 8.6–10.2)
CO2: 23 mmol/L (ref 20–29)
CREATININE: 1.6 mg/dL — AB (ref 0.76–1.27)
Chloride: 105 mmol/L (ref 96–106)
GFR, EST AFRICAN AMERICAN: 53 mL/min/{1.73_m2} — AB (ref >59)
GFR, EST NON AFRICAN AMERICAN: 46 mL/min/{1.73_m2} — AB (ref >59)
Glucose: 105 mg/dL — ABNORMAL HIGH (ref 65–99)
Potassium: 5 mmol/L (ref 3.5–5.2)
Sodium: 142 mmol/L (ref 134–144)

## 2016-12-10 LAB — BRAIN NATRIURETIC PEPTIDE: BNP: 100.8 pg/mL — ABNORMAL HIGH (ref 0.0–100.0)

## 2016-12-20 ENCOUNTER — Telehealth: Payer: Self-pay | Admitting: Cardiology

## 2016-12-20 NOTE — Telephone Encounter (Signed)
Has questions about issues with Furosimide

## 2016-12-20 NOTE — Telephone Encounter (Signed)
Left message to return call 

## 2016-12-21 ENCOUNTER — Other Ambulatory Visit: Payer: Self-pay

## 2016-12-21 MED ORDER — TORSEMIDE 20 MG PO TABS: 20.0000 mg | ORAL_TABLET | Freq: Two times a day (BID) | ORAL | 3 refills | Status: DC

## 2016-12-21 NOTE — Telephone Encounter (Signed)
Spoke with Hilda LiasMarie at the group home. States she knew there was a question about furosemide, but unsure of the question. Gave me Baxter HireKristen, the nurse's phone number. [facility].

## 2016-12-21 NOTE — Telephone Encounter (Signed)
Refill for torsemide sent to [facility] [place], per request.

## 2017-01-17 NOTE — Progress Notes (Signed)
Cardiology Office Note:    Date:  01/18/2017   ID:  Charles Knox, DOB [birth year], MRN [id number]  PCP:  Shelbie AmmonsHaque, Imran P, MD  Cardiologist:  Norman HerrlichBrian Shahed Yeoman, MD    Referring MD: Shelbie AmmonsHaque, Imran P, MD    ASSESSMENT:    1. Chronic diastolic heart failure (HCC)   2. Hypertensive heart disease with heart failure (HCC)   3. Coronary artery disease involving native coronary artery of native heart with angina pectoris (HCC)   4. Chronic obstructive pulmonary disease, unspecified COPD type (HCC)    PLAN:    In order of problems listed above:  1. He has not particularly volume overloaded but is decompensated with unexplained hypotension he will be evaluated in the emergency room. 2. Worsened with unexplained hypotension 3. At risk for decompensation troponin is important as part of the evaluation does not appear to be having acute coronary syndrome without new ischemic EKG changes or chest pain 4. Stable   Next appointment: 2 weeks   Medication Adjustments/Labs and Tests Ordered: Current medicines are reviewed at length with the patient today.  Concerns regarding medicines are outlined above.  No orders of the defined types were placed in this encounter.  No orders of the defined types were placed in this encounter.   Chief Complaint  Patient presents with  . Follow-up    6 week f/u; lasix increased last visit    History of Present Illness:    Charles Merlesalmadge Sevey is a 61 y.o. male with a hx of COPD and previous respiratory failure and mechanical ventilation, CAD with CABG 2013, EF 44% in 2016 hypertension hyperlipidemia and [employer] pacemaker  last seen 6 weeks ago with decompensated heart failure and referred to EP for device management. Compliance with diet, lifestyle and medications: Uncertain He presents for follow-up to my office today unfortunately is not the typical aide who brings him she has little or no knowledge of his healthcare he did not bring a list of medications and  although he has a device that he electronically is transmitting blood pressure and heart rate of weight and oxygen saturation to his PCP apparently was malfunctioning today and he has no values with him.  He had been clean visit for the last 5 days.  He presents to my office saying he feels well asymptomatic but he is vasoconstricted with cool skin and a blood pressure in the range of 80s systolic palpable and auscultated.  As he was asymptomatic and his EKG was stable initially I was going to draw labs at the 8 go to the home and call our office and we reconcile his medications.  Unfortunately while we are in process of doing this he became lightheaded made decision to transfer him to the emergency room as he is at risk for polypharmacy exacerbation of his coronary disease and even anemia and requires a quicker evaluation.  I called the emergency room discussed with the ED physician and my staff transported him in a safe fashion.  He is being scheduled for EP follow-up is not pacemaker dependent his rhythm today is sinus bifascicular heart block stable EKG Past Medical History:  Diagnosis Date  . Agitation 12/29/2013  . Angina pectoris (HCC) 11/22/2016  . Asthma 11/22/2016  . Chest pain, unspecified 11/22/2016  . COPD (chronic obstructive pulmonary disease) (HCC) 11/22/2016  . Coronary disease 11/22/2016   Overview:  CABG 2013  . 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 Hypercholesterolemia 11/22/2016  . Hypertension 11/22/2016  .  Hypothyroid 11/22/2016  . Obstructive sleep apnea 11/22/2016  . PAC (premature atrial contraction) 09/21/2016  . Pacemaker reprogramming/check 09/21/2016  . Schizoaffective disorder (HCC) 12/28/2013  . Sick sinus syndrome (HCC) 11/22/2016  . SOB (shortness of breath) 11/22/2016  . 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  . SSS (sick sinus syndrome) (HCC) 09/21/2016  . Unstable angina (HCC) 11/22/2016    Past Surgical History:  Procedure Laterality Date  .  CARDIAC PACEMAKER PLACEMENT    . NEPHRECTOMY      Current Medications: Current Meds  Medication Sig  . ARIPiprazole ER (ABILIFY MAINTENA) 400 MG PRSY Inject one (1) unit into the muscle every 4 weeks  . aspirin EC 81 MG tablet Take 81 mg daily by mouth.  Marland Kitchen atorvastatin (LIPITOR) 40 MG tablet Take 40 mg daily by mouth.  . budesonide-formoterol (SYMBICORT) 160-4.5 MCG/ACT inhaler Inhale 2 puffs 2 (two) times daily into the lungs.  . clopidogrel (PLAVIX) 75 MG tablet Take by mouth.  . docusate sodium (COLACE) 100 MG capsule Take 100 mg by mouth.  . furosemide (LASIX) 40 MG tablet Take 40 mg daily by mouth.  . haloperidol (HALDOL) 5 MG tablet Take 5 mg every morning by mouth. Take 10 mg at bedtime  . ibuprofen (ADVIL,MOTRIN) 800 MG tablet Take 800 mg every 6 (six) hours as needed by mouth for moderate pain.  Marland Kitchen ipratropium-albuterol (DUONEB) 0.5-2.5 (3) MG/3ML SOLN Take 3 mLs every 6 (six) hours as needed by nebulization.  . isosorbide mononitrate (IMDUR) 60 MG 24 hr tablet Take 60 mg by mouth.  . levothyroxine (SYNTHROID, LEVOTHROID) 75 MCG tablet Take by mouth.  Marland Kitchen lisinopril (PRINIVIL,ZESTRIL) 5 MG tablet Take by mouth.  . loratadine (CLARITIN) 10 MG tablet Take 10 mg daily by mouth.  Marland Kitchen LORazepam (ATIVAN) 0.5 MG tablet Take 0.5 mg by mouth.  . metFORMIN (GLUCOPHAGE) 500 MG tablet Take 2 (two) times daily with a meal by mouth.  . metoprolol tartrate (LOPRESSOR) 25 MG tablet Take 25 mg 2 (two) times daily by mouth.   . mirabegron ER (MYRBETRIQ) 50 MG TB24 tablet Take 50 mg daily by mouth.  . montelukast (SINGULAIR) 10 MG tablet Take 10 mg by mouth.  . nitroGLYCERIN (NITROSTAT) 0.4 MG SL tablet Place 0.4 mg under the tongue.  Marland Kitchen omega-3 acid ethyl esters (LOVAZA) 1 g capsule Take 4 (four) times daily by mouth.  . pantoprazole (PROTONIX) 40 MG tablet Take 40 mg 2 (two) times daily by mouth.   . potassium chloride (K-DUR,KLOR-CON) 10 MEQ tablet Take 10 mEq daily by mouth.  . pravastatin  (PRAVACHOL) 40 MG tablet Take by mouth.  . sertraline (ZOLOFT) 100 MG tablet Take 1 tablet by mouth every morning  . sitaGLIPtin (JANUVIA) 50 MG tablet Take 50 mg by mouth.  . tamsulosin (FLOMAX) 0.4 MG CAPS capsule Take 0.4 mg daily by mouth.  . torsemide (DEMADEX) 20 MG tablet Take 1 tablet (20 mg total) by mouth 2 (two) times daily.  . Vitamin D, Ergocalciferol, (DRISDOL) 50000 units CAPS capsule Take 50,000 Units every 7 (seven) days by mouth.     Allergies:   Penicillins   Social History   Socioeconomic History  . Marital status: Single    Spouse name: None  . Number of children: None  . Years of education: None  . Highest education level: None  Social Needs  . Financial resource strain: None  . Food insecurity - worry: None  . Food insecurity - inability: None  . Transportation  needs - medical: None  . Transportation needs - non-medical: None  Occupational History  . None  Tobacco Use  . Smoking status: Current Some Day Smoker  . Smokeless tobacco: Never Used  Substance and Sexual Activity  . Alcohol use: No  . Drug use: No  . Sexual activity: None  Other Topics Concern  . None  Social History Narrative  . None     Family History: The patient's family history is not on file. ROS:   Please see the history of present illness.    All other systems reviewed and are negative.  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  Recent Labs: 12/05/2016: BNP 100.8; BUN 32; Creatinine, Ser 1.60; Potassium 5.0; Sodium 142  Recent Lipid Panel    Component Value Date/Time   CHOL 129 12/10/2012 0115   TRIG 155 12/10/2012 0115   HDL 31 (L) 12/10/2012 0115   VLDL 31 12/10/2012 0115   LDLCALC 67 12/10/2012  0115    Physical Exam:    VS:  BP (!) 84/60 (BP Location: Left Arm, Patient Position: Sitting)   Pulse 72   Ht 5\' 5"  (1.651 m)   Wt 226 lb 0.6 oz (102.5 kg)   SpO2 93%   BMI 37.62 kg/m     Wt Readings from Last 3 Encounters:  01/18/17 226 lb 0.6 oz (102.5 kg)  12/05/16 225 lb (102.1 kg)     GEN: cool skin Well nourished, well developed in no acute distress HEENT: Normal NECK: No JVD; No carotid bruits LYMPHATICS: No lymphadenopathy CARDIAC: RRR, no murmurs, rubs, gallops RESPIRATORY:  Clear to auscultation without rales, wheezing or rhonchi  ABDOMEN: Soft, non-tender, non-distended MUSCULOSKELETAL:  No edema; No deformity  SKIN: Warm and dry NEUROLOGIC:  Alert and oriented x 3 PSYCHIATRIC:  Normal affect    Signed, Norman HerrlichBrian Serah Nicoletti, MD  01/18/2017 10:39 AM    [facility]

## 2017-01-18 ENCOUNTER — Encounter: Payer: Self-pay | Admitting: Cardiology

## 2017-01-18 ENCOUNTER — Ambulatory Visit (INDEPENDENT_AMBULATORY_CARE_PROVIDER_SITE_OTHER): Payer: Medicare Other | Admitting: Cardiology

## 2017-01-18 ENCOUNTER — Other Ambulatory Visit: Payer: Self-pay

## 2017-01-18 ENCOUNTER — Emergency Department (HOSPITAL_BASED_OUTPATIENT_CLINIC_OR_DEPARTMENT_OTHER)
Admission: EM | Admit: 2017-01-18 | Discharge: 2017-01-18 | Disposition: A | Discharge status: Home / Self Care | Payer: Medicare Other | Attending: Emergency Medicine | Admitting: Emergency Medicine

## 2017-01-18 ENCOUNTER — Encounter (HOSPITAL_BASED_OUTPATIENT_CLINIC_OR_DEPARTMENT_OTHER): Payer: Self-pay | Admitting: *Deleted

## 2017-01-18 ENCOUNTER — Ambulatory Visit: Payer: Medicaid Other | Admitting: Cardiology

## 2017-01-18 VITALS — BP 84/60 | HR 72 | Ht 65.0 in | Wt 226.0 lb

## 2017-01-18 DIAGNOSIS — N189 Chronic kidney disease, unspecified: Secondary | ICD-10-CM | POA: Diagnosis not present

## 2017-01-18 DIAGNOSIS — I110 Hypertensive heart disease with heart failure: Secondary | ICD-10-CM

## 2017-01-18 DIAGNOSIS — Z7902 Long term (current) use of antithrombotics/antiplatelets: Secondary | ICD-10-CM | POA: Insufficient documentation

## 2017-01-18 DIAGNOSIS — Z7984 Long term (current) use of oral hypoglycemic drugs: Secondary | ICD-10-CM | POA: Insufficient documentation

## 2017-01-18 DIAGNOSIS — Z79899 Other long term (current) drug therapy: Secondary | ICD-10-CM | POA: Diagnosis not present

## 2017-01-18 DIAGNOSIS — F17200 Nicotine dependence, unspecified, uncomplicated: Secondary | ICD-10-CM | POA: Insufficient documentation

## 2017-01-18 DIAGNOSIS — E1122 Type 2 diabetes mellitus with diabetic chronic kidney disease: Secondary | ICD-10-CM | POA: Diagnosis not present

## 2017-01-18 DIAGNOSIS — I2510 Atherosclerotic heart disease of native coronary artery without angina pectoris: Secondary | ICD-10-CM | POA: Insufficient documentation

## 2017-01-18 DIAGNOSIS — J45909 Unspecified asthma, uncomplicated: Secondary | ICD-10-CM | POA: Insufficient documentation

## 2017-01-18 DIAGNOSIS — Z7982 Long term (current) use of aspirin: Secondary | ICD-10-CM | POA: Diagnosis not present

## 2017-01-18 DIAGNOSIS — Z950 Presence of cardiac pacemaker: Secondary | ICD-10-CM | POA: Insufficient documentation

## 2017-01-18 DIAGNOSIS — I25119 Atherosclerotic heart disease of native coronary artery with unspecified angina pectoris: Secondary | ICD-10-CM

## 2017-01-18 DIAGNOSIS — E039 Hypothyroidism, unspecified: Secondary | ICD-10-CM | POA: Diagnosis not present

## 2017-01-18 DIAGNOSIS — I5032 Chronic diastolic (congestive) heart failure: Secondary | ICD-10-CM | POA: Insufficient documentation

## 2017-01-18 DIAGNOSIS — I130 Hypertensive heart and chronic kidney disease with heart failure and stage 1 through stage 4 chronic kidney disease, or unspecified chronic kidney disease: Secondary | ICD-10-CM | POA: Insufficient documentation

## 2017-01-18 DIAGNOSIS — R42 Dizziness and giddiness: Secondary | ICD-10-CM | POA: Diagnosis present

## 2017-01-18 DIAGNOSIS — N179 Acute kidney failure, unspecified: Secondary | ICD-10-CM | POA: Diagnosis not present

## 2017-01-18 DIAGNOSIS — J449 Chronic obstructive pulmonary disease, unspecified: Secondary | ICD-10-CM | POA: Diagnosis not present

## 2017-01-18 LAB — CBC WITH DIFFERENTIAL/PLATELET
Basophils Absolute: 0 10*3/uL (ref 0.0–0.1)
Basophils Relative: 0 %
Eosinophils Absolute: 0.1 10*3/uL (ref 0.0–0.7)
Eosinophils Relative: 2 %
HCT: 36.5 % — ABNORMAL LOW (ref 39.0–52.0)
Hemoglobin: 12 g/dL — ABNORMAL LOW (ref 13.0–17.0)
Lymphocytes Relative: 18 %
Lymphs Abs: 0.8 10*3/uL (ref 0.7–4.0)
MCH: 31.3 pg (ref 26.0–34.0)
MCHC: 32.9 g/dL (ref 30.0–36.0)
MCV: 95.3 fL (ref 78.0–100.0)
Monocytes Absolute: 0.4 10*3/uL (ref 0.1–1.0)
Monocytes Relative: 9 %
Neutro Abs: 3.2 10*3/uL (ref 1.7–7.7)
Neutrophils Relative %: 71 %
Platelets: 154 10*3/uL (ref 150–400)
RBC: 3.83 MIL/uL — ABNORMAL LOW (ref 4.22–5.81)
RDW: 13.3 % (ref 11.5–15.5)
WBC: 4.5 10*3/uL (ref 4.0–10.5)

## 2017-01-18 LAB — BASIC METABOLIC PANEL
Anion gap: 10 (ref 5–15)
BUN: 46 mg/dL — ABNORMAL HIGH (ref 6–20)
CO2: 29 mmol/L (ref 22–32)
Calcium: 8.7 mg/dL — ABNORMAL LOW (ref 8.9–10.3)
Chloride: 104 mmol/L (ref 101–111)
Creatinine, Ser: 2.38 mg/dL — ABNORMAL HIGH (ref 0.61–1.24)
GFR calc Af Amer: 32 mL/min — ABNORMAL LOW (ref >60)
GFR calc non Af Amer: 28 mL/min — ABNORMAL LOW (ref >60)
Glucose, Bld: 87 mg/dL (ref 65–99)
Potassium: 3.9 mmol/L (ref 3.5–5.1)
Sodium: 143 mmol/L (ref 135–145)

## 2017-01-18 MED ORDER — SODIUM CHLORIDE 0.9 % IV BOLUS (SEPSIS)
500.0000 mL | Freq: Once | INTRAVENOUS | Status: AC
  Administered 2017-01-18: 500 mL via INTRAVENOUS

## 2017-01-18 MED ORDER — MORPHINE SULFATE (PF) 4 MG/ML IV SOLN: 4.0000 mg | Freq: Once | INTRAVENOUS | Status: DC

## 2017-01-18 NOTE — ED Notes (Signed)
Charles Knox from group home called to pick patient up.  States someone will be here to pick patient up within the hour.

## 2017-01-18 NOTE — ED Notes (Signed)
Discharge paperwork faxed to pharmacy at request of group home.

## 2017-01-18 NOTE — Patient Instructions (Signed)
Medication Instructions:  Your physician recommends that you continue on your current medications as directed. Please refer to the Current Medication list given to you today.  Labwork: Your physician recommends that you have the following labs drawn: BMP, CBC, and BNP  Testing/Procedures: None  Follow-Up: Your physician recommends that you schedule a follow-up appointment in: 2 weeks  Any Other Special Instructions Will Be Listed Below (If Applicable).  EP appointment will be made asap  Please call the office with medication list- Ask for Morrie Sheldonshley    If you need a refill on your cardiac medications before your next appointment, please call your pharmacy.   [facility]  Garey HamAshley A, RN, BSN

## 2017-01-18 NOTE — ED Notes (Signed)
Patient reports some shortness of breath-placed on 2L via [place]

## 2017-01-18 NOTE — Addendum Note (Signed)
Addended by: Craige CottaANDERSON, ASHLEY S on: 01/18/2017 11:02 AM   Modules accepted: Orders

## 2017-01-18 NOTE — ED Triage Notes (Signed)
Pt sent here from [facility] for abnormal ekg. Taken directly from waiting room to room 2, assisted into gown, ekg performed and given to dr. Juleen ChinaKohut. Pt reports while at his regularly scheduled appointment with cardiology this morning he had sudden onset of feeling dizzy, they performed an ekg and sent pt here for further eval. Pt denies chest pain, pressure, tightness, sob, nausea or any other c/o "nothing but this dizzyness." pt lives in a group home, caregiver has to leave to take another member back to [place], states she will grab his home meds and come right back. Pt is alert to baseline, pleasant and cooperative with care.

## 2017-01-18 NOTE — Discharge Instructions (Addendum)
Hold demadex (torsemide) and lisinopril (prinvil) for 3 days. Resume lisinopril after 3 days. Resume demadex after 3 days but only take 20mg  once per day. Follow-up with Dr Dulce SellarMunley next week.

## 2017-01-18 NOTE — ED Notes (Signed)
ED Provider at bedside. 

## 2017-01-18 NOTE — ED Provider Notes (Signed)
[facility] EMERGENCY DEPARTMENT Provider Note   CSN: [id number] Arrival date & time: 01/18/17  1035     History   Chief Complaint Chief Complaint  Patient presents with  . Dizziness    HPI Charles Knox is a 61 y.o. male.  HPI   61yM with dizziness. Had scheduled appointment with cardiology today and noted to have BP 80s/60s.  He says his dizziness began today.  He denies any other symptoms.  No pain.  No dyspnea.   Prescriptions per outpatient note from 05/2016:  . albuterol (PROVENTIL HFA;VENTOLIN HFA) 90 mcg/actuation inhaler, Inhale 2 puffs every six (6) hours as needed for wheezing., Disp: , Rfl:  . ARIPiprazole (ABILIFY) 20 MG tablet, Take 20 mg by mouth daily., Disp: , Rfl:  . aspirin 325 MG tablet, Take 325 mg by mouth daily., Disp: , Rfl:  . clopidogrel (PLAVIX) 75 mg tablet, Take 75 mg by mouth daily., Disp: , Rfl:  . docusate sodium (COLACE) 100 MG capsule, Take 100 mg by mouth daily., Disp: , Rfl:  . fluticasone-salmeterol (ADVAIR HFA) 230-21 mcg/actuation inhaler, Inhale 2 puffs Two (2) times a day., Disp: , Rfl:  . haloperidol (HALDOL) 2 MG tablet, Take 1 tablet (2 mg total) by mouth nightly., Disp: 30 tablet, Rfl: 0 . isosorbide mononitrate (IMDUR) 60 MG 24 hr tablet, Take 60 mg by mouth daily., Disp: , Rfl:  . levothyroxine (SYNTHROID, LEVOTHROID) 75 MCG tablet, Take 75 mcg by mouth daily at 0600., Disp: , Rfl:  . lisinopril (PRINIVIL,ZESTRIL) 5 MG tablet, Take 5 mg by mouth daily., Disp: , Rfl:  . LORazepam (ATIVAN) 0.5 MG tablet, Take 1 tablet (0.5 mg total) by mouth nightly., Disp: 30 tablet, Rfl: 0 . metoprolol succinate (TOPROL-XL) 25 MG 24 hr tablet, Take 25 mg by mouth daily., Disp: , Rfl:  . montelukast (SINGULAIR) 10 mg tablet, Take 10 mg by mouth nightly., Disp: , Rfl:  . nitroglycerin (NITROSTAT) 0.4 MG SL tablet, Place 0.4 mg under the tongue every five (5) minutes as needed for chest pain., Disp: , Rfl:  . pantoprazole (PROTONIX) 40 MG  tablet, Take 40 mg by mouth daily., Disp: , Rfl:  . pravastatin (PRAVACHOL) 40 MG tablet, Take 40 mg by mouth daily., Disp: , Rfl:  . sertraline (ZOLOFT) 100 MG tablet, Take 100 mg by mouth daily., Disp: , Rfl:  . sitaGLIPtin (JANUVIA) 50 MG tablet, Take 50 mg by mouth daily., Disp: , Rfl:  . travoprost (TRAVATAN Z) 0.004 % Drop, 1 drop daily., Disp: , Rfl:  . ranolazine (RANEXA) 500 MG 12 hr tablet, Take 1,000 mg by mouth Two (2) times a day., Disp: , Rfl:       Past Medical History:  Diagnosis Date  . Agitation 12/29/2013  . Angina pectoris (HCC) 11/22/2016  . Asthma 11/22/2016  . Chest pain, unspecified 11/22/2016  . COPD (chronic obstructive pulmonary disease) (HCC) 11/22/2016  . Coronary disease 11/22/2016   Overview:  CABG 2013  . 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 Hypercholesterolemia 11/22/2016  . Hypertension 11/22/2016  . Hypothyroid 11/22/2016  . Obstructive sleep apnea 11/22/2016  . PAC (premature atrial contraction) 09/21/2016  . Pacemaker reprogramming/check 09/21/2016  . Schizoaffective disorder (HCC) 12/28/2013  . Sick sinus syndrome (HCC) 11/22/2016  . SOB (shortness of breath) 11/22/2016  . 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  . SSS (sick sinus syndrome) (HCC) 09/21/2016  . Unstable angina (HCC) 11/22/2016    Patient Active Problem List  Diagnosis Date Noted  . Chronic diastolic heart failure (HCC) 12/05/2016  . CKD (chronic kidney disease) 12/05/2016  . Angina pectoris (HCC) 11/22/2016  . Chest pain, unspecified 11/22/2016  . Coronary artery disease involving native coronary artery of native heart with angina pectoris (HCC) 11/22/2016  . Hypercholesterolemia 11/22/2016  . Hypertensive heart disease with heart failure (HCC) 11/22/2016  . Obstructive sleep apnea 11/22/2016  . Sick sinus syndrome (HCC) 11/22/2016  . SOB (shortness of breath) 11/22/2016  . Unstable angina (HCC) 11/22/2016  . Asthma 11/22/2016  . COPD (chronic  obstructive pulmonary disease) (HCC) 11/22/2016  . 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 Hypothyroid 11/22/2016  . 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 Intellectual disability 10/17/2016  . PAC (premature atrial contraction) 09/21/2016  . Pacemaker reprogramming/check 09/21/2016  . SSS (sick sinus syndrome) (HCC) 09/21/2016  . Agitation 12/29/2013  . Schizoaffective disorder (HCC) 12/28/2013    Past Surgical History:  Procedure Laterality Date  . CARDIAC PACEMAKER PLACEMENT    . NEPHRECTOMY         Home Medications    Prior to Admission medications   Medication Sig Start Date End Date Taking? Authorizing Provider  ARIPiprazole ER (ABILIFY MAINTENA) 400 MG PRSY Inject one (1) unit into the muscle every 4 weeks 08/13/16 03/25/17  [provider]  aspirin EC 81 MG tablet Take 81 mg daily by mouth.    [provider]  atorvastatin (LIPITOR) 40 MG tablet Take 40 mg daily by mouth.    [provider]  budesonide-formoterol (SYMBICORT) 160-4.5 MCG/ACT inhaler Inhale 2 puffs 2 (two) times daily into the lungs.    [provider]  clopidogrel (PLAVIX) 75 MG tablet Take by mouth.    [provider]  docusate sodium (COLACE) 100 MG capsule Take 100 mg by mouth.    [provider]  furosemide (LASIX) 40 MG tablet Take 40 mg daily by mouth.    [provider]  haloperidol (HALDOL) 5 MG tablet Take 5 mg every morning by mouth. Take 10 mg at bedtime    [provider]  ibuprofen (ADVIL,MOTRIN) 800 MG tablet Take 800 mg every 6 (six) hours as needed by mouth for moderate pain.    [provider]  ipratropium-albuterol (DUONEB) 0.5-2.5 (3) MG/3ML SOLN Take 3 mLs every 6 (six) hours as needed by nebulization.    [provider]  isosorbide mononitrate (IMDUR) 60 MG 24 hr tablet Take 60 mg by mouth.    [provider]  levothyroxine (SYNTHROID, LEVOTHROID) 75 MCG tablet Take by mouth.    [provider]  lisinopril (PRINIVIL,ZESTRIL) 5 MG tablet Take by mouth.    [provider]  loratadine (CLARITIN) 10 MG tablet Take 10 mg daily by mouth.    [provider]  LORazepam (ATIVAN) 0.5 MG tablet Take 0.5 mg by mouth. 12/29/13   [provider]  metFORMIN (GLUCOPHAGE) 500 MG tablet Take 2 (two) times daily with a meal by mouth.    [provider]  metoprolol tartrate (LOPRESSOR) 25 MG tablet Take 25 mg 2 (two) times daily by mouth.     [provider]  mirabegron ER (MYRBETRIQ) 50 MG TB24 tablet Take 50 mg daily by mouth.    [provider]  montelukast (SINGULAIR) 10 MG tablet Take 10 mg by mouth.    [provider]  nitroGLYCERIN (NITROSTAT) 0.4 MG SL tablet Place 0.4 mg under the tongue.    [provider]  olopatadine (  PATANOL) 0.1 % ophthalmic solution Place 1 drop daily into both eyes.    [provider]  omega-3 acid ethyl esters (LOVAZA) 1 g capsule Take 4 (four) times daily by mouth.    [provider]  pantoprazole (PROTONIX) 40 MG tablet Take 40 mg 2 (two) times daily by mouth.     [provider]  potassium chloride (K-DUR,KLOR-CON) 10 MEQ tablet Take 10 mEq daily by mouth.    [provider]  pravastatin (PRAVACHOL) 40 MG tablet Take by mouth.    [provider]  sertraline (ZOLOFT) 100 MG tablet Take 1 tablet by mouth every morning    [provider]  sitaGLIPtin (JANUVIA) 50 MG tablet Take 50 mg by mouth.    [provider]  tamsulosin (FLOMAX) 0.4 MG CAPS capsule Take 0.4 mg daily by mouth.    [provider]  torsemide (DEMADEX) 20 MG tablet Take 1 tablet (20 mg total) by mouth 2 (two) times daily. 12/21/16 03/21/17  Baldo DaubMunley, Brian J, MD  Vitamin D, Ergocalciferol, (DRISDOL) 50000 units CAPS capsule Take 50,000 Units every 7 (seven) days by mouth.    [provider]    Family History History reviewed. No pertinent  family history.  Social History Social History   Tobacco Use  . Smoking status: Current Some Day Smoker  . Smokeless tobacco: Never Used  Substance Use Topics  . Alcohol use: No  . Drug use: No     Allergies   Penicillins   Review of Systems Review of Systems   Physical Exam Updated Vital Signs BP 102/66 (BP Location: Right Arm)   Pulse 67   Temp 98 F (36.7 C) (Oral)   Resp 18   Ht 5\' 5"  (1.651 m)   Wt 99.8 kg (220 lb)   SpO2 96%   BMI 36.61 kg/m   Physical Exam  Constitutional: He appears well-developed and well-nourished. No distress.  Laying in bed.  No acute distress.  Somewhat pale appearing.  HENT:  Head: Normocephalic and atraumatic.  Eyes: Conjunctivae are normal. Right eye exhibits no discharge. Left eye exhibits no discharge.  Neck: Neck supple.  Cardiovascular: Normal rate, regular rhythm and normal heart sounds. Exam reveals no gallop and no friction rub.  No murmur heard. Pulmonary/Chest: Effort normal and breath sounds normal. No respiratory distress.  Abdominal: Soft. He exhibits no distension. There is no tenderness.  Musculoskeletal: He exhibits no edema or tenderness.  Neurological: He is alert.  Skin: Skin is warm and dry.  Psychiatric: He has a normal mood and affect. His behavior is normal. Thought content normal.  Nursing note and vitals reviewed.    ED Treatments / Results  Labs (all labs ordered are listed, but only abnormal results are displayed) Labs Reviewed  CBC WITH DIFFERENTIAL/PLATELET - Abnormal; Notable for the following components:      Result Value   RBC 3.83 (*)    Hemoglobin 12.0 (*)    HCT 36.5 (*)    All other components within normal limits  BASIC METABOLIC PANEL - Abnormal; Notable for the following components:   BUN 46 (*)    Creatinine, Ser 2.38 (*)    Calcium 8.7 (*)    GFR calc non Af Amer 28 (*)    GFR calc Af Amer 32 (*)    All other components within normal limits  URINALYSIS, ROUTINE W REFLEX  MICROSCOPIC    EKG  EKG Interpretation None       Radiology No results  found.  Procedures Procedures (including critical care time)  Medications Ordered in ED Medications - No data to display   Initial Impression / Assessment and Plan / ED Course  I have reviewed the triage vital signs and the nursing notes.  Pertinent labs & imaging results that were available during my care of the patient were reviewed by me and considered in my medical decision making (see chart for details).     61yM with dizziness. Mild hypotension today. Not sure of what his baseline may be. BP was also 84/60 during office visit on 12/05/16.  Worsening renal function.  He was given some IV fluids with improvement of blood pressure and resolution of symptoms.  He was ambulated in the emergency room with no complaints.  Recently started on Demadex.  Written instructions to hold lisinopril and Demadex for 3 days.  Resume lisinopril after that.  Resume Demadex but at only 20 mg/day until he can follow back up.  Needs repeat BMP next week and repeat follow-up.  Final Clinical Impressions(s) / ED Diagnoses   Final diagnoses:  Dizziness  AKI (acute kidney injury) [facility](HCC)    ED Discharge Orders    None       Raeford RazorKohut, Drayk Humbarger, MD 01/22/17 1718

## 2017-01-25 ENCOUNTER — Ambulatory Visit (INDEPENDENT_AMBULATORY_CARE_PROVIDER_SITE_OTHER): Payer: Medicare Other | Admitting: Sports Medicine

## 2017-01-25 ENCOUNTER — Encounter: Payer: Self-pay | Admitting: Sports Medicine

## 2017-01-25 DIAGNOSIS — M79671 Pain in right foot: Secondary | ICD-10-CM

## 2017-01-25 DIAGNOSIS — E1142 Type 2 diabetes mellitus with diabetic polyneuropathy: Secondary | ICD-10-CM

## 2017-01-25 DIAGNOSIS — B351 Tinea unguium: Secondary | ICD-10-CM

## 2017-01-25 DIAGNOSIS — M79672 Pain in left foot: Secondary | ICD-10-CM

## 2017-01-25 NOTE — Progress Notes (Signed)
Subjective: Charles Knox is a 62 y.o. male patient with history of diabetes who presents to office today complaining of long, painful nails  while ambulating in shoes; unable to trim. Patient states that the glucose reading this morning was not recorded. Patient denies any other issues.   Patient is assisted by facility manager.  Patient Active Problem List   Diagnosis Date Noted  . Chronic diastolic heart failure ([place]) 12/05/2016  . CKD (chronic kidney disease) 12/05/2016  . Angina pectoris ([place]) 11/22/2016  . Chest pain, unspecified 11/22/2016  . Coronary artery disease involving native coronary artery of native heart with angina pectoris ([place]) 11/22/2016  . Hypercholesterolemia 11/22/2016  . Hypertensive heart disease with heart failure ([place]) 11/22/2016  . Obstructive sleep apnea 11/22/2016  . Sick sinus syndrome ([place]) 11/22/2016  . SOB (shortness of breath) 11/22/2016  . Unstable angina ([place]) 11/22/2016  . Asthma 11/22/2016  . COPD (chronic obstructive pulmonary disease) ([place]) 11/22/2016  . Diabetes 1.5, managed as type 2 ([place]) 11/22/2016  . Hypothyroid 11/22/2016  . 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 Intellectual disability 10/17/2016  . PAC (premature atrial contraction) 09/21/2016  . Pacemaker reprogramming/check 09/21/2016  . SSS (sick sinus syndrome) ([place]) 09/21/2016  . Agitation 12/29/2013  . Schizoaffective disorder ([place]) 12/28/2013   Current Outpatient Medications on File Prior to Visit  Medication Sig Dispense Refill  . ARIPiprazole ER (ABILIFY MAINTENA) 400 MG PRSY Inject one (1) unit into the muscle every 4 weeks    . aspirin EC 81 MG tablet Take 81 mg daily by mouth.    Marland Kitchen atorvastatin (LIPITOR) 40 MG tablet Take 40 mg daily by mouth.    . budesonide-formoterol (SYMBICORT) 160-4.5 MCG/ACT inhaler Inhale 2 puffs 2 (two) times daily into the lungs.    . clopidogrel (PLAVIX) 75 MG tablet Take by mouth.    . docusate sodium (COLACE) 100 MG capsule Take 100 mg by mouth.     . furosemide (LASIX) 40 MG tablet Take 40 mg daily by mouth.    . haloperidol (HALDOL) 5 MG tablet Take 5 mg every morning by mouth. Take 10 mg at bedtime    . ibuprofen (ADVIL,MOTRIN) 800 MG tablet Take 800 mg every 6 (six) hours as needed by mouth for moderate pain.    Marland Kitchen ipratropium-albuterol (DUONEB) 0.5-2.5 (3) MG/3ML SOLN Take 3 mLs every 6 (six) hours as needed by nebulization.    . isosorbide mononitrate (IMDUR) 60 MG 24 hr tablet Take 60 mg by mouth.    . levothyroxine (SYNTHROID, LEVOTHROID) 75 MCG tablet Take by mouth.    Marland Kitchen lisinopril (PRINIVIL,ZESTRIL) 5 MG tablet Take by mouth.    . loratadine (CLARITIN) 10 MG tablet Take 10 mg daily by mouth.    Marland Kitchen LORazepam (ATIVAN) 0.5 MG tablet Take 0.5 mg by mouth.    . metFORMIN (GLUCOPHAGE) 500 MG tablet Take 2 (two) times daily with a meal by mouth.    . metoprolol tartrate (LOPRESSOR) 25 MG tablet Take 25 mg 2 (two) times daily by mouth.     . mirabegron ER (MYRBETRIQ) 50 MG TB24 tablet Take 50 mg daily by mouth.    . montelukast (SINGULAIR) 10 MG tablet Take 10 mg by mouth.    . nitroGLYCERIN (NITROSTAT) 0.4 MG SL tablet Place 0.4 mg under the tongue.    Marland Kitchen olopatadine (PATANOL) 0.1 % ophthalmic solution Place 1 drop daily into both eyes.    Marland Kitchen omega-3 acid ethyl esters (LOVAZA) 1 g capsule Take 4 (four) times daily by mouth.    Marland Kitchen  pantoprazole (PROTONIX) 40 MG tablet Take 40 mg 2 (two) times daily by mouth.     . potassium chloride (K-DUR,KLOR-CON) 10 MEQ tablet Take 10 mEq daily by mouth.    . pravastatin (PRAVACHOL) 40 MG tablet Take by mouth.    . sertraline (ZOLOFT) 100 MG tablet Take 1 tablet by mouth every morning    . sitaGLIPtin (JANUVIA) 50 MG tablet Take 50 mg by mouth.    . tamsulosin (FLOMAX) 0.4 MG CAPS capsule Take 0.4 mg daily by mouth.    . torsemide (DEMADEX) 20 MG tablet Take 1 tablet (20 mg total) by mouth 2 (two) times daily. 180 tablet 3  . Vitamin D, Ergocalciferol, (DRISDOL) 50000 units CAPS capsule Take 50,000  Units every 7 (seven) days by mouth.     No current facility-administered medications on file prior to visit.    Allergies  Allergen Reactions  . Penicillins Other (See Comments)    Other reaction(s): Other (See Comments) MAKES HIM FEEL DRUNK MAKES HIM FEEL DRUNK    Recent Results (from the past 2160 hour(s))  Basic Metabolic Panel (BMET)     Status: Abnormal   Collection Time: 12/05/16 12:44 PM  Result Value Ref Range   Glucose 105 (H) 65 - 99 mg/dL   BUN 32 (H) 8 - 27 mg/dL   Creatinine, Ser 1.60 (H) 0.76 - 1.27 mg/dL   GFR calc non Af Amer 46 (L) >59 mL/min/1.73   GFR calc Af Amer 53 (L) >59 mL/min/1.73   BUN/Creatinine Ratio 20 10 - 24   Sodium 142 134 - 144 mmol/L   Potassium 5.0 3.5 - 5.2 mmol/L   Chloride 105 96 - 106 mmol/L   CO2 23 20 - 29 mmol/L   Calcium 8.7 8.6 - 10.2 mg/dL  B Nat Peptide     Status: Abnormal   Collection Time: 12/05/16 12:44 PM  Result Value Ref Range   BNP 100.8 (H) 0.0 - 100.0 pg/mL  CBC with Differential     Status: Abnormal   Collection Time: 01/18/17 11:04 AM  Result Value Ref Range   WBC 4.5 4.0 - 10.5 K/uL   RBC 3.83 (L) 4.22 - 5.81 MIL/uL   Hemoglobin 12.0 (L) 13.0 - 17.0 g/dL   HCT 36.5 (L) 39.0 - 52.0 %   MCV 95.3 78.0 - 100.0 fL   MCH 31.3 26.0 - 34.0 pg   MCHC 32.9 30.0 - 36.0 g/dL   RDW 13.3 11.5 - 15.5 %   Platelets 154 150 - 400 K/uL   Neutrophils Relative % 71 %   Neutro Abs 3.2 1.7 - 7.7 K/uL   Lymphocytes Relative 18 %   Lymphs Abs 0.8 0.7 - 4.0 K/uL   Monocytes Relative 9 %   Monocytes Absolute 0.4 0.1 - 1.0 K/uL   Eosinophils Relative 2 %   Eosinophils Absolute 0.1 0.0 - 0.7 K/uL   Basophils Relative 0 %   Basophils Absolute 0.0 0.0 - 0.1 K/uL  Basic metabolic panel     Status: Abnormal   Collection Time: 01/18/17 11:04 AM  Result Value Ref Range   Sodium 143 135 - 145 mmol/L   Potassium 3.9 3.5 - 5.1 mmol/L   Chloride 104 101 - 111 mmol/L   CO2 29 22 - 32 mmol/L   Glucose, Bld 87 65 - 99 mg/dL   BUN 46 (H)  6 - 20 mg/dL   Creatinine, Ser 2.38 (H) 0.61 - 1.24 mg/dL   Calcium 8.7 (L) 8.9 - 10.3  mg/dL   GFR calc non Af Amer 28 (L) >60 mL/min   GFR calc Af Amer 32 (L) >60 mL/min    Comment: (NOTE) The eGFR has been calculated using the CKD EPI equation. This calculation has not been validated in all clinical situations. eGFR's persistently <60 mL/min signify possible Chronic Kidney Disease.    Anion gap 10 5 - 15    Objective: General: Patient is awake, alert, and oriented x 3 and in no acute distress.  Integument: Skin is warm, dry and supple bilateral. Nails are tender, long, thickened and  dystrophic with subungual debris, consistent with onychomycosis, 1-5 bilateral. No signs of infection.  Bruise to left first toe with no pain or acute symptoms.  No open lesions or preulcerative lesions present bilateral. Remaining integument unremarkable.  Vasculature:  Dorsalis Pedis pulse 1/4 bilateral. Posterior Tibial pulse  2/4 bilateral.  Capillary fill time <3 sec 1-5 bilateral. Positive hair growth to the level of the digits. Temperature gradient within normal limits. No varicosities present bilateral. No edema present bilateral.   Neurology: The patient has intact sensation measured with a 5.07/10g Semmes Weinstein Monofilament at all pedal sites bilateral . Vibratory sensation diminished bilateral with tuning fork. No Babinski sign present bilateral.   Musculoskeletal: Asymptomatic pes planus pedal deformities noted bilateral. Muscular strength 5/5 in all lower extremity muscular groups bilateral without pain on range of motion. No tenderness with calf compression bilateral.  Assessment and Plan: Problem List Items Addressed This Visit    None    Visit Diagnoses    Dermatophytosis of nail    -  Primary   Diabetic polyneuropathy associated with type 2 diabetes mellitus (HCC)       Foot pain, bilateral         -Examined patient. -Discussed and educated patient on diabetic foot care,  especially with  regards to the vascular, neurological and musculoskeletal systems.  -Stressed the importance of good glycemic control and the detriment of not  controlling glucose levels in relation to the foot. -Mechanically debrided all nails 1-5 bilateral using sterile nail nipper and filed with dremel without incident  -Awaiting diabetic shoes fitting/measurement; patient to take his medical doctor to sign off on paperwork was given to patient to take to Dr. Milly Jakob. -We will closely monitor bruise at left first toe -Patient to return  in 3 months for at risk foot Nail care care and for diabetic shoe measurement when paperwork has been completed -Patient advised to call the office if any problems or questions arise in the meantime.  Landis Martins, DPM

## 2017-01-31 DIAGNOSIS — I959 Hypotension, unspecified: Secondary | ICD-10-CM | POA: Insufficient documentation

## 2017-01-31 NOTE — Progress Notes (Signed)
Cardiology Office Note:    Date:  02/01/2017   ID:  Charles Knox, DOB [birth year], MRN [id number]  PCP:  Shelbie Ammons, MD  Cardiologist:  Norman Herrlich, MD    Referring MD: Shelbie Ammons, MD    ASSESSMENT:    1. Chronic diastolic heart failure (HCC)   2. Hypotension, unspecified hypotension type   3. Hypertensive heart disease with heart failure (HCC)   4. Coronary artery disease involving native coronary artery of native heart with angina pectoris (HCC)   5. Pacemaker    PLAN:    In order of problems listed above:  1. Improved stable today to continue current diuretic 2. Resolved EF is normal as it ACE inhibitor but CKD 3. Stable blood pressure today hypotension is resolved 4. But stable CAD continue medical treatment   Next appointment: 3 months   Medication Adjustments/Labs and Tests Ordered: Current medicines are reviewed at length with the patient today.  Concerns regarding medicines are outlined above.  Orders Placed This Encounter  Procedures  . Basic Metabolic Panel (BMET)  . B Nat Peptide  . Ambulatory referral to Cardiac Electrophysiology   No orders of the defined types were placed in this encounter.   Chief Complaint  Patient presents with  . Congestive Heart Failure    History of Present Illness:    Charles Knox is a 62 y.o. male with a hx of COPD and previous respiratory failure and mechanical ventilation,CAD with CABG 2013, EF 44% in 2016hypertension hyperlipidemia and Medtronicpacemaker   last seen 2 weeks ago with hypotension and decompensated heart failure..His diuretic was held for several days and ACEI was discontinued. Compliance with diet, lifestyle and medications: Yes He is improved no shortness of breath chest pain palpitation or syncope He request device follow-up in my practice referral to device clinic He has significant renal dysfunction last visit ACE inhibitor discontinued recheck BMP BNP today Past Medical History:    Diagnosis Date  . Agitation 12/29/2013  . Angina pectoris (HCC) 11/22/2016  . Asthma 11/22/2016  . Chest pain, unspecified 11/22/2016  . COPD (chronic obstructive pulmonary disease) (HCC) 11/22/2016  . Coronary disease 11/22/2016   Overview:  CABG 2013  . 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 Hypercholesterolemia 11/22/2016  . Hypertension 11/22/2016  . Hypothyroid 11/22/2016  . Obstructive sleep apnea 11/22/2016  . PAC (premature atrial contraction) 09/21/2016  . Pacemaker reprogramming/check 09/21/2016  . Schizoaffective disorder (HCC) 12/28/2013  . Sick sinus syndrome (HCC) 11/22/2016  . SOB (shortness of breath) 11/22/2016  . 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  . SSS (sick sinus syndrome) (HCC) 09/21/2016  . Unstable angina (HCC) 11/22/2016    Past Surgical History:  Procedure Laterality Date  . CARDIAC PACEMAKER PLACEMENT    . NEPHRECTOMY      Current Medications: Current Meds  Medication Sig  . ARIPiprazole ER (ABILIFY MAINTENA) 400 MG PRSY Inject one (1) unit into the muscle every 4 weeks  . aspirin EC 81 MG tablet Take 81 mg daily by mouth.  Marland Kitchen atorvastatin (LIPITOR) 40 MG tablet Take 40 mg daily by mouth.  . clopidogrel (PLAVIX) 75 MG tablet Take by mouth.  . cyanocobalamin (,VITAMIN B-12,) 1000 MCG/ML injection Inject 1,000 mcg into the muscle every 21 ( twenty-one) days.  . Glycopyrrolate-Formoterol (BEVESPI AEROSPHERE) 9-4.8 MCG/ACT AERO Inhale 2 puffs into the lungs 2 (two) times daily.  . haloperidol (HALDOL) 5 MG tablet Take 5 mg every morning by mouth. Take 10  mg at bedtime  . ibuprofen (ADVIL,MOTRIN) 800 MG tablet Take 800 mg every 6 (six) hours as needed by mouth for moderate pain.  . isosorbide mononitrate (IMDUR) 60 MG 24 hr tablet Take 60 mg by mouth.  . levothyroxine (SYNTHROID, LEVOTHROID) 75 MCG tablet Take by mouth.  . metoprolol tartrate (LOPRESSOR) 25 MG tablet Take 25 mg 2 (two) times daily by mouth.   . mirabegron ER (MYRBETRIQ)  50 MG TB24 tablet Take 50 mg daily by mouth.  . omega-3 acid ethyl esters (LOVAZA) 1 g capsule Take 4 (four) times daily by mouth.  . pantoprazole (PROTONIX) 40 MG tablet Take 40 mg 2 (two) times daily by mouth.   . potassium chloride (K-DUR,KLOR-CON) 10 MEQ tablet Take 10 mEq daily by mouth.  . sertraline (ZOLOFT) 100 MG tablet Take 1 tablet by mouth every morning  . sitaGLIPtin (JANUVIA) 50 MG tablet Take 50 mg by mouth.  . tamsulosin (FLOMAX) 0.4 MG CAPS capsule Take 0.4 mg daily by mouth.  . torsemide (DEMADEX) 20 MG tablet Take 1 tablet (20 mg total) by mouth 2 (two) times daily.  . Vitamin D, Ergocalciferol, (DRISDOL) 50000 units CAPS capsule Take 50,000 Units every 7 (seven) days by mouth.  . [DISCONTINUED] furosemide (LASIX) 40 MG tablet Take 40 mg daily by mouth.  . [DISCONTINUED] metFORMIN (GLUCOPHAGE) 500 MG tablet Take 2 (two) times daily with a meal by mouth.     Allergies:   Penicillins   Social History   Socioeconomic History  . Marital status: Single    Spouse name: None  . Number of children: None  . Years of education: None  . Highest education level: None  Social Needs  . Financial resource strain: None  . Food insecurity - worry: None  . Food insecurity - inability: None  . Transportation needs - medical: None  . Transportation needs - non-medical: None  Occupational History  . None  Tobacco Use  . Smoking status: Current Some Day Smoker  . Smokeless tobacco: Never Used  Substance and Sexual Activity  . Alcohol use: No  . Drug use: No  . Sexual activity: None  Other Topics Concern  . None  Social History Narrative  . None     Family History: The patient'sfamily history is not on file. ROS:   Please see the history of present illness.    All other systems reviewed and are negative.  EKGs/Labs/Other Studies Reviewed:    The following studies were reviewed today:    Recent Labs: 12/05/2016: BNP 100.8 01/18/2017: BUN 46; Creatinine, Ser 2.38;  Hemoglobin 12.0; Platelets 154; Potassium 3.9; Sodium 143  Recent Lipid Panel    Component Value Date/Time   CHOL 129 12/10/2012 0115   TRIG 155 12/10/2012 0115   HDL 31 (L) 12/10/2012 0115   VLDL 31 12/10/2012 0115   LDLCALC 67 12/10/2012 0115    Physical Exam:    VS:  BP 98/60 (BP Location: Left Arm, Patient Position: Sitting, Cuff Size: Large)   Pulse 97   Ht 5\' 5"  (1.651 m)   Wt 233 lb 1.3 oz (105.7 kg)   SpO2 95%   BMI 38.79 kg/m     Wt Readings from Last 3 Encounters:  02/01/17 233 lb 1.3 oz (105.7 kg)  01/18/17 220 lb (99.8 kg)  01/18/17 226 lb 0.6 oz (102.5 kg)     GEN:  Well nourished, well developed in no acute distress HEENT: Normal NECK: No JVD; No carotid bruits LYMPHATICS: No lymphadenopathy CARDIAC:  RRR, no murmurs, rubs, gallops RESPIRATORY:  Clear to auscultation without rales, wheezing or rhonchi  ABDOMEN: Soft, non-tender, non-distended MUSCULOSKELETAL:  No edema; No deformity  SKIN: Warm and dry NEUROLOGIC:  Alert and oriented x 3 PSYCHIATRIC:  Normal affect    Signed, Norman Herrlich, MD  02/01/2017 11:11 AM    [facility]

## 2017-02-01 ENCOUNTER — Ambulatory Visit (INDEPENDENT_AMBULATORY_CARE_PROVIDER_SITE_OTHER): Payer: Medicare Other | Admitting: Cardiology

## 2017-02-01 ENCOUNTER — Encounter: Payer: Self-pay | Admitting: Cardiology

## 2017-02-01 VITALS — BP 98/60 | HR 97 | Ht 65.0 in | Wt 233.1 lb

## 2017-02-01 DIAGNOSIS — I959 Hypotension, unspecified: Secondary | ICD-10-CM

## 2017-02-01 DIAGNOSIS — I5032 Chronic diastolic (congestive) heart failure: Secondary | ICD-10-CM

## 2017-02-01 DIAGNOSIS — Z950 Presence of cardiac pacemaker: Secondary | ICD-10-CM

## 2017-02-01 DIAGNOSIS — I110 Hypertensive heart disease with heart failure: Secondary | ICD-10-CM

## 2017-02-01 DIAGNOSIS — I25119 Atherosclerotic heart disease of native coronary artery with unspecified angina pectoris: Secondary | ICD-10-CM | POA: Diagnosis not present

## 2017-02-01 NOTE — Patient Instructions (Addendum)
Medication Instructions:  Your physician recommends that you continue on your current medications as directed. Please refer to the Current Medication list given to you today.  Labwork: Your physician recommends that you have the following labs drawn: BMP and BNP  Testing/Procedures: None  Follow-Up: Your physician recommends that you schedule a follow-up appointment in: 3 months  Any Other Special Instructions Will Be Listed Below (If Applicable).     If you need a refill on your cardiac medications before your next appointment, please call your pharmacy.   [facility]  Garey HamAshley A, RN, BSN   Heart Failure  Weigh yourself every morning when you first wake up and record on a calender or note pad, bring this to your office visits. Using a pill tender can help with taking your medications consistently.  Limit your fluid intake to 2 liters daily  Limit your sodium intake to less than 2-3 grams daily. Ask if you need dietary teaching.  If you gain more than 3 pounds (from your dry weight ), double your dose of diuretic for the day.  If you gain more than 5 pounds (from your dry weight), double your dose of lasix and call your heart failure doctor.  Please do not smoke tobacco since it is very bad for your heart.  Please do not drink alcohol since it can worsen your heart failure.Also avoid OTC nonsteroidal drugs, such as advil, aleve and motrin.  Try to exercise for at least 30 minutes every day because this will help your heart be more efficient. You may be eligible for supervised cardiac rehab, ask your physician.

## 2017-02-02 LAB — BASIC METABOLIC PANEL
BUN / CREAT RATIO: 12 (ref 10–24)
BUN: 22 mg/dL (ref 8–27)
CALCIUM: 9 mg/dL (ref 8.6–10.2)
CHLORIDE: 108 mmol/L — AB (ref 96–106)
CO2: 24 mmol/L (ref 20–29)
CREATININE: 1.91 mg/dL — AB (ref 0.76–1.27)
GFR calc Af Amer: 43 mL/min/{1.73_m2} — ABNORMAL LOW (ref >59)
GFR calc non Af Amer: 37 mL/min/{1.73_m2} — ABNORMAL LOW (ref >59)
GLUCOSE: 90 mg/dL (ref 65–99)
Potassium: 4.8 mmol/L (ref 3.5–5.2)
Sodium: 146 mmol/L — ABNORMAL HIGH (ref 134–144)

## 2017-02-02 LAB — BRAIN NATRIURETIC PEPTIDE: BNP: 166 pg/mL — AB (ref 0.0–100.0)

## 2017-02-06 ENCOUNTER — Encounter: Payer: Medicare Other | Admitting: Cardiology

## 2017-02-25 ENCOUNTER — Ambulatory Visit (INDEPENDENT_AMBULATORY_CARE_PROVIDER_SITE_OTHER): Payer: Medicare Other | Admitting: Cardiology

## 2017-02-25 ENCOUNTER — Encounter: Payer: Self-pay | Admitting: Cardiology

## 2017-02-25 VITALS — BP 118/62 | HR 70 | Ht 65.0 in | Wt 220.0 lb

## 2017-02-25 DIAGNOSIS — E785 Hyperlipidemia, unspecified: Secondary | ICD-10-CM

## 2017-02-25 DIAGNOSIS — I25708 Atherosclerosis of coronary artery bypass graft(s), unspecified, with other forms of angina pectoris: Secondary | ICD-10-CM

## 2017-02-25 DIAGNOSIS — I10 Essential (primary) hypertension: Secondary | ICD-10-CM

## 2017-02-25 DIAGNOSIS — I495 Sick sinus syndrome: Secondary | ICD-10-CM

## 2017-02-25 DIAGNOSIS — Z950 Presence of cardiac pacemaker: Secondary | ICD-10-CM | POA: Diagnosis not present

## 2017-02-25 LAB — CUP PACEART INCLINIC DEVICE CHECK
Battery Impedance: 7139 Ohm
Date Time Interrogation Session: 20190204100633
Implantable Lead Location: 753859
Implantable Lead Location: 753860
Implantable Lead Model: 4076
Lead Channel Impedance Value: 546 Ohm
Lead Channel Pacing Threshold Amplitude: 1 V
Lead Channel Pacing Threshold Pulse Width: 0.4 ms
Lead Channel Sensing Intrinsic Amplitude: 11.2 mV
Lead Channel Sensing Intrinsic Amplitude: 2.8 mV
Lead Channel Setting Pacing Amplitude: 2 V
Lead Channel Setting Pacing Amplitude: 2.5 V
MDC IDC LEAD IMPLANT DT: 20050804
MDC IDC LEAD IMPLANT DT: 20050804
MDC IDC MSMT BATTERY REMAINING LONGEVITY: 1 mo
MDC IDC MSMT BATTERY VOLTAGE: 2.63 V
MDC IDC MSMT LEADCHNL RA IMPEDANCE VALUE: 413 Ohm
MDC IDC MSMT LEADCHNL RV PACING THRESHOLD AMPLITUDE: 1 V
MDC IDC MSMT LEADCHNL RV PACING THRESHOLD PULSEWIDTH: 0.4 ms
MDC IDC PG IMPLANT DT: 20050804
MDC IDC SET LEADCHNL RV PACING PULSEWIDTH: 0.4 ms
MDC IDC SET LEADCHNL RV SENSING SENSITIVITY: 2.8 mV

## 2017-02-25 NOTE — Progress Notes (Signed)
Electrophysiology Office Note   Date:  02/25/2017   ID:  Charles Knox, DOB 1955-08-15, MRN [id number]  PCP:  Shelbie Ammons, MD  Cardiologist:  Dulce Sellar Primary Electrophysiologist:  Regan Lemming, MD    Chief Complaint  Patient presents with  . Pacemaker Check    SIck sinus syndrome     History of Present Illness: Charles Knox is a 62 y.o. male who is being seen today for the evaluation of pacemaker at the request of Norman Herrlich. Presenting today for electrophysiology evaluation.  He has a history of chronic diastolic heart failure, hypertensive heart disease, coronary artery disease with stable angina, and is status post pacemaker implant for sick sinus syndrome.  He also has COPD with previous respiratory failure requiring mechanical ventilation.  He had a CABG in 2013.   Today, he denies symptoms of palpitations, chest pain, shortness of breath, orthopnea, PND, lower extremity edema, claudication, dizziness, presyncope, syncope, bleeding, or neurologic sequela. The patient is tolerating medications without difficulties.    Past Medical History:  Diagnosis Date  . Agitation 12/29/2013  . Angina pectoris (HCC) 11/22/2016  . Asthma 11/22/2016  . Chest pain, unspecified 11/22/2016  . COPD (chronic obstructive pulmonary disease) (HCC) 11/22/2016  . Coronary disease 11/22/2016   Overview:  CABG 2013  . 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 Hypercholesterolemia 11/22/2016  . Hypertension 11/22/2016  . Hypothyroid 11/22/2016  . Obstructive sleep apnea 11/22/2016  . PAC (premature atrial contraction) 09/21/2016  . Pacemaker reprogramming/check 09/21/2016  . Schizoaffective disorder (HCC) 12/28/2013  . Sick sinus syndrome (HCC) 11/22/2016  . SOB (shortness of breath) 11/22/2016  . 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  . SSS (sick sinus syndrome) (HCC) 09/21/2016  . Unstable angina (HCC) 11/22/2016   Past Surgical History:  Procedure Laterality Date  .  CARDIAC PACEMAKER PLACEMENT    . NEPHRECTOMY       Current Outpatient Medications  Medication Sig Dispense Refill  . ARIPiprazole ER (ABILIFY MAINTENA) 400 MG PRSY Inject one (1) unit into the muscle every 4 weeks    . aspirin EC 81 MG tablet Take 81 mg daily by mouth.    Marland Kitchen atorvastatin (LIPITOR) 40 MG tablet Take 40 mg daily by mouth.    . budesonide-formoterol (SYMBICORT) 160-4.5 MCG/ACT inhaler Inhale 2 puffs 2 (two) times daily into the lungs.    . clopidogrel (PLAVIX) 75 MG tablet Take by mouth.    . furosemide (LASIX) 20 MG tablet Take 40 mg by mouth daily.    . Glycopyrrolate-Formoterol (BEVESPI AEROSPHERE) 9-4.8 MCG/ACT AERO Inhale 2 puffs into the lungs 2 (two) times daily.    . haloperidol (HALDOL) 5 MG tablet Take 5 mg every morning by mouth. Take 10 mg at bedtime    . ipratropium-albuterol (DUONEB) 0.5-2.5 (3) MG/3ML SOLN Take 3 mLs every 6 (six) hours as needed by nebulization.    . isosorbide mononitrate (IMDUR) 60 MG 24 hr tablet Take 60 mg by mouth.    . levothyroxine (SYNTHROID, LEVOTHROID) 75 MCG tablet Take by mouth.    . metoprolol tartrate (LOPRESSOR) 25 MG tablet Take 25 mg 2 (two) times daily by mouth.     . mirabegron ER (MYRBETRIQ) 50 MG TB24 tablet Take 50 mg daily by mouth.    . nitroGLYCERIN (NITROSTAT) 0.4 MG SL tablet Place 0.4 mg under the tongue.    Marland Kitchen Olopatadine HCl (PATADAY) 0.2 % SOLN Place 1 drop into both eyes daily.    Marland Kitchen  omega-3 acid ethyl esters (LOVAZA) 1 g capsule Take 4 (four) times daily by mouth.    . pantoprazole (PROTONIX) 40 MG tablet Take 40 mg 2 (two) times daily by mouth.     . sacubitril-valsartan (ENTRESTO) 24-26 MG Take 1 tablet by mouth 2 (two) times daily.    . sertraline (ZOLOFT) 100 MG tablet Take 1 tablet by mouth every morning    . tamsulosin (FLOMAX) 0.4 MG CAPS capsule Take 0.4 mg daily by mouth.    . Vitamin D, Ergocalciferol, (DRISDOL) 50000 units CAPS capsule Take 50,000 Units every 7 (seven) days by mouth.     No current  facility-administered medications for this visit.     Allergies:   Penicillins   Social History:  The patient  reports that he has been smoking.  he has never used smokeless tobacco. He reports that he does not drink alcohol or use drugs.   Family History:  The patient's family history includes Diabetes in his mother; Heart attack in his father; Stroke in his father and mother.    ROS:  Please see the history of present illness.   Otherwise, review of systems is positive for SOB, snoring.   All other systems are reviewed and negative.    PHYSICAL EXAM: VS:  BP 118/62   Pulse 70   Ht 5\' 5"  (1.651 m)   Wt 220 lb (99.8 kg)   BMI 36.61 kg/m  , BMI Body mass index is 36.61 kg/m. GEN: Well nourished, well developed, in no acute distress  HEENT: normal  Neck: no JVD, carotid bruits, or masses Cardiac: RRR; no murmurs, rubs, or gallops,no edema  Respiratory:  clear to auscultation bilaterally, normal work of breathing GI: soft, nontender, nondistended, + BS MS: no deformity or atrophy  Skin: warm and dry, device pocket is well healed Neuro:  Strength and sensation are intact Psych: euthymic mood, full affect  EKG:  EKG is not ordered today. Personal review of the ekg ordered 01/21/17 shows SR, RBBB, IMI  Device interrogation is reviewed today in detail.  See PaceArt for details.   Recent Labs: 01/18/2017: Hemoglobin 12.0; Platelets 154 02/01/2017: BNP 166.0; BUN 22; Creatinine, Ser 1.91; Potassium 4.8; Sodium 146    Lipid Panel     Component Value Date/Time   CHOL 129 12/10/2012 0115   TRIG 155 12/10/2012 0115   HDL 31 (L) 12/10/2012 0115   VLDL 31 12/10/2012 0115   LDLCALC 67 12/10/2012 0115     Wt Readings from Last 3 Encounters:  02/25/17 220 lb (99.8 kg)  02/01/17 233 lb 1.3 oz (105.7 kg)  01/18/17 220 lb (99.8 kg)      Other studies Reviewed: Additional studies/ records that were reviewed today include: TTE 11/15/16 Review of the above records today  demonstrates:  LVEF 55%, apical hypokinesis Impaired diastolic relaxation normal left atrial size mild to moderate mitral regurgitation Aortic sclerosis without stenosis Normal right heart pressures  ASSESSMENT AND PLAN:  1.  Sick sinus syndrome: Post Medtronic dual-chamber pacemaker.  Device functioning appropriately.  Device is reaching ERI.  We Yorley Buch plan to have him follow-up in device clinic in 6 weeks for battery check.  2.  Coronary artery disease status post CABG with chronic stable angina: Currently no chest pain.  Continue current management.  3.  Hypertension: Blood pressure well controlled.  No changes.  4.  Hyperlipidemia: Continue atorvastatin   Current medicines are reviewed at length with the patient today.   The patient does not have  concerns regarding his medicines.  The following changes were made today:  none  Labs/ tests ordered today include:  No orders of the defined types were placed in this encounter.  Disposition:   FU with device clinic 6 weeks  Signed, Dahl Higinbotham Jorja Loa, MD  02/25/2017 10:02 AM     [facility] [address] [place] [address] [phone] (office) [phone] (fax)

## 2017-02-25 NOTE — Patient Instructions (Signed)
Medication Instructions:  Your physician recommends that you continue on your current medications as directed. Please refer to the Current Medication list given to you today.  Labwork: None ordered.  Testing/Procedures: None ordered.  Follow-Up: Your physician recommends that you schedule a follow-up appointment in: 6 weeks with the Device Clinic  Any Other Special Instructions Will Be Listed Below (If Applicable).     If you need a refill on your cardiac medications before your next appointment, please call your pharmacy.

## 2017-04-02 ENCOUNTER — Other Ambulatory Visit: Payer: Self-pay | Admitting: Cardiology

## 2017-04-10 ENCOUNTER — Ambulatory Visit (INDEPENDENT_AMBULATORY_CARE_PROVIDER_SITE_OTHER): Payer: Self-pay | Admitting: *Deleted

## 2017-04-10 DIAGNOSIS — I495 Sick sinus syndrome: Secondary | ICD-10-CM

## 2017-04-10 NOTE — Progress Notes (Signed)
Device check for battery, battery longevity <811months, <1-615months. ROV w/ DC 05/09/17 for battery check.

## 2017-04-25 ENCOUNTER — Ambulatory Visit: Payer: Medicare Other | Admitting: Sports Medicine

## 2017-05-09 ENCOUNTER — Ambulatory Visit (INDEPENDENT_AMBULATORY_CARE_PROVIDER_SITE_OTHER): Payer: Self-pay | Admitting: *Deleted

## 2017-05-09 DIAGNOSIS — I495 Sick sinus syndrome: Secondary | ICD-10-CM

## 2017-05-09 LAB — CUP PACEART INCLINIC DEVICE CHECK
Battery Impedance: 9270 Ohm
Battery Remaining Longevity: 1 mo — CL
Battery Voltage: 2.58 V
Implantable Lead Implant Date: 20050804
Implantable Lead Implant Date: 20050804
Implantable Lead Location: 753860
Implantable Lead Model: 4076
Implantable Lead Model: 4076
Implantable Pulse Generator Implant Date: 20050804
Lead Channel Impedance Value: 428 Ohm
Lead Channel Impedance Value: 669 Ohm
MDC IDC LEAD LOCATION: 753859
MDC IDC SESS DTM: 20190418104323
MDC IDC SET LEADCHNL RA PACING AMPLITUDE: 2 V
MDC IDC SET LEADCHNL RV PACING AMPLITUDE: 2.5 V
MDC IDC SET LEADCHNL RV PACING PULSEWIDTH: 0.4 ms
MDC IDC SET LEADCHNL RV SENSING SENSITIVITY: 2.8 mV

## 2017-05-09 NOTE — Progress Notes (Signed)
Pacemaker Battery check Estimated longevity <601months, <1-652months.  ROV w/ WC 06/24/17

## 2017-06-24 ENCOUNTER — Telehealth: Payer: Self-pay | Admitting: *Deleted

## 2017-06-24 ENCOUNTER — Encounter: Payer: Medicare Other | Admitting: Cardiology

## 2017-06-24 NOTE — Telephone Encounter (Signed)
Called to confirm that Mr. Charles Knox would be at his appointment with Dr. Estill Doomsamntiz 07/09/17 as he rescheduled today's appointment. His pacemaker is close to reaching elective replacement. Mr. Charles Knox is unavailable to come to the phone. I spoke with Tyrece who transports the residents. He confirms that he will be at the newly scheduled appointment. I offered 2 appointment slots on Wednesday, he is unable to accommodate for the time.

## 2017-06-24 NOTE — Progress Notes (Deleted)
Electrophysiology Office Note   Date:  06/24/2017   ID:  Charles Knox, DOB 11-Mar-1955, MRN [id number]  PCP:  Raelyn Number, MD  Cardiologist:  Bettina Gavia Primary Electrophysiologist:  Constance Haw, MD    No chief complaint on file.    History of Present Illness: Charles Knox is a 62 y.o. male who is being seen today for the evaluation of pacemaker at the request of Charles Knox. Presenting today for electrophysiology evaluation.  He has a history of chronic diastolic heart failure, hypertensive heart disease, coronary artery disease with stable angina, and is status post pacemaker implant for sick sinus syndrome.  He also has COPD with previous respiratory failure requiring mechanical ventilation.  He had a CABG in 2013.  Today, denies symptoms of palpitations, chest pain, shortness of breath, orthopnea, PND, lower extremity edema, claudication, dizziness, presyncope, syncope, bleeding, or neurologic sequela. The patient is tolerating medications without difficulties. ***    Past Medical History:  Diagnosis Date  . Agitation 12/29/2013  . Angina pectoris ([place]) 11/22/2016  . Asthma 11/22/2016  . Chest pain, unspecified 11/22/2016  . COPD (chronic obstructive pulmonary disease) ([place]) 11/22/2016  . Coronary disease 11/22/2016   Overview:  CABG 2013  . 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 Hypercholesterolemia 11/22/2016  . Hypertension 11/22/2016  . Hypothyroid 11/22/2016  . Obstructive sleep apnea 11/22/2016  . PAC (premature atrial contraction) 09/21/2016  . Pacemaker reprogramming/check 09/21/2016  . Schizoaffective disorder ([place]) 12/28/2013  . Sick sinus syndrome ([place]) 11/22/2016  . SOB (shortness of breath) 11/22/2016  . 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  . SSS (sick sinus syndrome) ([place]) 09/21/2016  . Unstable angina ([place]) 11/22/2016   Past Surgical History:  Procedure Laterality Date  . CARDIAC PACEMAKER PLACEMENT    . NEPHRECTOMY        Current Outpatient Medications  Medication Sig Dispense Refill  . arformoterol (BROVANA) 15 MCG/2ML NEBU Take 15 mcg by nebulization 2 (two) times daily.    . ARIPiprazole ER (ABILIFY MAINTENA) 400 MG PRSY prefilled syringe Inject 400 mg into the muscle every 28 (twenty-eight) days.    Marland Kitchen aspirin EC 81 MG tablet Take 81 mg daily by mouth.    Marland Kitchen atorvastatin (LIPITOR) 40 MG tablet Take 40 mg daily by mouth.    . budesonide-formoterol (SYMBICORT) 160-4.5 MCG/ACT inhaler Inhale 2 puffs 2 (two) times daily into the lungs.    . clopidogrel (PLAVIX) 75 MG tablet Take by mouth.    . Cyanocobalamin 1000 MCG/ML KIT Inject 1,000 mcg as directed every 28 (twenty-eight) days.    . Glycopyrrolate-Formoterol (BEVESPI AEROSPHERE) 9-4.8 MCG/ACT AERO Inhale 2 puffs into the lungs 2 (two) times daily.    . haloperidol (HALDOL) 5 MG tablet Take 5 mg every morning by mouth. Take 10 mg at bedtime    . ipratropium-albuterol (DUONEB) 0.5-2.5 (3) MG/3ML SOLN Take 3 mLs every 6 (six) hours as needed by nebulization.    . isosorbide mononitrate (IMDUR) 60 MG 24 hr tablet Take 60 mg by mouth.    . levothyroxine (SYNTHROID, LEVOTHROID) 75 MCG tablet Take 50 mcg by mouth.     . metoprolol tartrate (LOPRESSOR) 25 MG tablet Take 25 mg 2 (two) times daily by mouth.     . mirabegron ER (MYRBETRIQ) 50 MG TB24 tablet Take 50 mg daily by mouth.    . nitroGLYCERIN (NITROSTAT) 0.4 MG SL tablet Place 0.4 mg under the tongue.    Marland Kitchen Olopatadine HCl (PATADAY)  0.2 % SOLN Place 1 drop into both eyes daily.    Marland Kitchen omega-3 acid ethyl esters (LOVAZA) 1 g capsule Take 4 (four) times daily by mouth.    . pantoprazole (PROTONIX) 40 MG tablet Take 40 mg 2 (two) times daily by mouth.     . potassium chloride (MICRO-K) 10 MEQ CR capsule Take 10 mEq by mouth daily.     . sacubitril-valsartan (ENTRESTO) 24-26 MG Take 1 tablet by mouth 2 (two) times daily.    . sertraline (ZOLOFT) 100 MG tablet Take 1 tablet by mouth every morning    .  sitaGLIPtin (JANUVIA) 100 MG tablet Take 100 mg by mouth daily.    . tamsulosin (FLOMAX) 0.4 MG CAPS capsule Take 0.4 mg daily by mouth.    . torsemide (DEMADEX) 20 MG tablet Take 20 mg by mouth daily.    . Vitamin D, Ergocalciferol, (DRISDOL) 50000 units CAPS capsule Take 50,000 Units every 7 (seven) days by mouth.     No current facility-administered medications for this visit.     Allergies:   Penicillins   Social History:  The patient  reports that he has been smoking.  He has never used smokeless tobacco. He reports that he does not drink alcohol or use drugs.   Family History:  The patient's family history includes Diabetes in his mother; Heart attack in his father; Stroke in his father and mother.   ROS:  Please see the history of present illness.   Otherwise, review of systems is positive for ***.   All other systems are reviewed and negative.   PHYSICAL EXAM: VS:  There were no vitals taken for this visit. , BMI There is no height or weight on file to calculate BMI. GEN: Well nourished, well developed, in no acute distress  HEENT: normal  Neck: no JVD, carotid bruits, or masses Cardiac: ***RRR; no murmurs, rubs, or gallops,no edema  Respiratory:  clear to auscultation bilaterally, normal work of breathing GI: soft, nontender, nondistended, + BS MS: no deformity or atrophy  Skin: warm and dry, ***device site well healed Neuro:  Strength and sensation are intact Psych: euthymic mood, full affect  EKG:  EKG {ACTION; IS/IS [id number]} ordered today. Personal review of the ekg ordered *** shows ***  ***Personal review of the device interrogation today. Results in [place]: 01/18/2017: Hemoglobin 12.0; Platelets 154 02/01/2017: BNP 166.0; BUN 22; Creatinine, Ser 1.91; Potassium 4.8; Sodium 146    Lipid Panel     Component Value Date/Time   CHOL 129 12/10/2012 0115   TRIG 155 12/10/2012 0115   HDL 31 (L) 12/10/2012 0115   VLDL 31 12/10/2012 0115   LDLCALC  67 12/10/2012 0115     Wt Readings from Last 3 Encounters:  02/25/17 220 lb (99.8 kg)  02/01/17 233 lb 1.3 oz (105.7 kg)  01/18/17 220 lb (99.8 kg)      Other studies Reviewed: Additional studies/ records that were reviewed today include: TTE 11/15/16 Review of the above records today demonstrates:  LVEF 55%, apical hypokinesis Impaired diastolic relaxation normal left atrial size mild to moderate mitral regurgitation Aortic sclerosis without stenosis Normal right heart pressures  ASSESSMENT AND PLAN:  1.  Sick sinus syndrome: Post Medtronic dual-chamber pacemaker.  Device functioning appropriately.  Device is reaching ERI.  We Jsiah Menta plan to have him follow-up in device clinic in 6 weeks for battery check.***  2.  Coronary artery disease status post CABG with chronic stable angina: Currently no  chest pain.  Continue current management.***  3.  Hypertension: Blood pressure well controlled.  No changes.***  4.  Hyperlipidemia: Continue atorvastatin***   Current medicines are reviewed at length with the patient today.   The patient does not have concerns regarding his medicines.  The following changes were made today:  ***  Labs/ tests ordered today include:  No orders of the defined types were placed in this encounter.  Disposition:   FU with Daequan Kozma *** weeks  Signed, Caitlinn Klinker Meredith Leeds, MD  06/24/2017 10:01 AM     [facility] [address] [place] [address] [phone] (office) [phone] (fax)

## 2017-07-09 ENCOUNTER — Ambulatory Visit (INDEPENDENT_AMBULATORY_CARE_PROVIDER_SITE_OTHER): Payer: Medicare Other | Admitting: Cardiology

## 2017-07-09 ENCOUNTER — Encounter (INDEPENDENT_AMBULATORY_CARE_PROVIDER_SITE_OTHER): Payer: Self-pay

## 2017-07-09 ENCOUNTER — Encounter: Payer: Self-pay | Admitting: Cardiology

## 2017-07-09 VITALS — BP 120/74 | HR 68 | Ht 65.0 in | Wt 210.2 lb

## 2017-07-09 DIAGNOSIS — I10 Essential (primary) hypertension: Secondary | ICD-10-CM | POA: Diagnosis not present

## 2017-07-09 DIAGNOSIS — I495 Sick sinus syndrome: Secondary | ICD-10-CM | POA: Diagnosis not present

## 2017-07-09 DIAGNOSIS — I2510 Atherosclerotic heart disease of native coronary artery without angina pectoris: Secondary | ICD-10-CM | POA: Diagnosis not present

## 2017-07-09 DIAGNOSIS — E785 Hyperlipidemia, unspecified: Secondary | ICD-10-CM

## 2017-07-09 LAB — CUP PACEART INCLINIC DEVICE CHECK
Implantable Lead Implant Date: 20050804
Implantable Lead Model: 4076
Lead Channel Setting Pacing Amplitude: 2 V
Lead Channel Setting Pacing Amplitude: 2.5 V
Lead Channel Setting Pacing Pulse Width: 0.4 ms
MDC IDC LEAD IMPLANT DT: 20050804
MDC IDC LEAD LOCATION: 753859
MDC IDC LEAD LOCATION: 753860
MDC IDC PG IMPLANT DT: 20050804
MDC IDC SESS DTM: 20190618170739
MDC IDC SET LEADCHNL RV SENSING SENSITIVITY: 2.8 mV

## 2017-07-09 NOTE — Patient Instructions (Signed)
Medication Instructions:  Your physician recommends that you continue on your current medications as directed. Please refer to the Current Medication list given to you today.  Labwork: None ordered     *We will only notify you of abnormal results, otherwise continue current treatment plan.  Testing/Procedures: Your physician has recommended that you have a pacemaker generator change (battery change).  The nurse will contact you to arrange this procedure.  Follow-Up: To be determined once procedure date is arranged.   * If you need a refill on your cardiac medications before your next appointment, please call your pharmacy.   *Please note that any paperwork needing to be filled out by the provider will need to be addressed at the front desk prior to seeing the provider. Please note that any FMLA, disability or other documents regarding health condition is subject to a $25.00 charge that must be received prior to completion of paperwork in the form of a money order or check.  Thank you for choosing [facility]!!   Dory HornSherri Hopelynn Gartland, RN [phone]

## 2017-07-09 NOTE — Progress Notes (Signed)
Electrophysiology Office Note   Date:  07/09/2017   ID:  Stephano Arrants, DOB 1956-01-12, MRN [id number]  PCP:  Raelyn Number, MD  Cardiologist:  Bettina Gavia Primary Electrophysiologist:  Constance Haw, MD    No chief complaint on file.    History of Present Illness: Charles Knox is a 62 y.o. male who is being seen today for the evaluation of pacemaker at the request of Shirlee More. Presenting today for electrophysiology evaluation.  He has a history of chronic diastolic heart failure, hypertensive heart disease, coronary artery disease with stable angina, and is status post pacemaker implant for sick sinus syndrome.  He also has COPD with previous respiratory failure requiring mechanical ventilation.  He had a CABG in 2013.  Today, denies symptoms of palpitations, chest pain, shortness of breath, orthopnea, PND, lower extremity edema, claudication, dizziness, presyncope, syncope, bleeding, or neurologic sequela. The patient is tolerating medications without difficulties.  Overall he is doing well.  He does have a Medtronic dual-chamber pacemaker implanted that is reached ERI and is ready for device generator change.  Otherwise no complaints.   Past Medical History:  Diagnosis Date  . Agitation 12/29/2013  . Angina pectoris ([place]) 11/22/2016  . Asthma 11/22/2016  . Chest pain, unspecified 11/22/2016  . COPD (chronic obstructive pulmonary disease) ([employer]) 11/22/2016  . Coronary disease 11/22/2016   Overview:  CABG 2013  . 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 Hypercholesterolemia 11/22/2016  . Hypertension 11/22/2016  . Hypothyroid 11/22/2016  . Obstructive sleep apnea 11/22/2016  . PAC (premature atrial contraction) 09/21/2016  . Pacemaker reprogramming/check 09/21/2016  . Schizoaffective disorder ([place]) 12/28/2013  . Sick sinus syndrome ([place]) 11/22/2016  . SOB (shortness of breath) 11/22/2016  . 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  . SSS (sick sinus syndrome)  ([place]) 09/21/2016  . Unstable angina ([place]) 11/22/2016   Past Surgical History:  Procedure Laterality Date  . CARDIAC PACEMAKER PLACEMENT    . NEPHRECTOMY       Current Outpatient Medications  Medication Sig Dispense Refill  . arformoterol (BROVANA) 15 MCG/2ML NEBU Take 15 mcg by nebulization 2 (two) times daily.    . ARIPiprazole ER (ABILIFY MAINTENA) 400 MG PRSY prefilled syringe Inject 400 mg into the muscle every 28 (twenty-eight) days.    Marland Kitchen aspirin EC 81 MG tablet Take 81 mg daily by mouth.    Marland Kitchen atorvastatin (LIPITOR) 40 MG tablet Take 40 mg daily by mouth.    . budesonide-formoterol (SYMBICORT) 160-4.5 MCG/ACT inhaler Inhale 2 puffs 2 (two) times daily into the lungs.    . clopidogrel (PLAVIX) 75 MG tablet Take by mouth.    . Cyanocobalamin 1000 MCG/ML KIT Inject 1,000 mcg as directed every 28 (twenty-eight) days.    . Glycopyrrolate-Formoterol (BEVESPI AEROSPHERE) 9-4.8 MCG/ACT AERO Inhale 2 puffs into the lungs 2 (two) times daily.    . haloperidol (HALDOL) 2 MG/ML solution Take 5 mLs by mouth at bedtime.    Marland Kitchen ipratropium-albuterol (DUONEB) 0.5-2.5 (3) MG/3ML SOLN Take 3 mLs every 6 (six) hours as needed by nebulization.    . isosorbide mononitrate (IMDUR) 60 MG 24 hr tablet Take 60 mg by mouth.    . levothyroxine (SYNTHROID, LEVOTHROID) 75 MCG tablet Take 50 mcg by mouth.     . metoprolol tartrate (LOPRESSOR) 25 MG tablet Take 25 mg 2 (two) times daily by mouth.     . mirabegron ER (MYRBETRIQ) 50 MG TB24 tablet Take 50 mg daily by mouth.    Marland Kitchen  nitroGLYCERIN (NITROSTAT) 0.4 MG SL tablet Place 0.4 mg under the tongue.    Marland Kitchen Olopatadine HCl (PATADAY) 0.2 % SOLN Place 1 drop into both eyes daily.    Marland Kitchen omega-3 acid ethyl esters (LOVAZA) 1 g capsule Take 4 (four) times daily by mouth.    . pantoprazole (PROTONIX) 40 MG tablet Take 40 mg 2 (two) times daily by mouth.     . potassium chloride (MICRO-K) 10 MEQ CR capsule Take 10 mEq by mouth daily.     . sacubitril-valsartan (ENTRESTO) 24-26  MG Take 1 tablet by mouth 2 (two) times daily.    . sertraline (ZOLOFT) 100 MG tablet Take 1 tablet by mouth every morning    . sitaGLIPtin (JANUVIA) 100 MG tablet Take 100 mg by mouth daily.    . tamsulosin (FLOMAX) 0.4 MG CAPS capsule Take 0.4 mg daily by mouth.    . torsemide (DEMADEX) 20 MG tablet Take 20 mg by mouth daily.    . Vitamin D, Ergocalciferol, (DRISDOL) 50000 units CAPS capsule Take 50,000 Units every 7 (seven) days by mouth.     No current facility-administered medications for this visit.     Allergies:   Penicillins   Social History:  The patient  reports that he has been smoking.  He has never used smokeless tobacco. He reports that he does not drink alcohol or use drugs.   Family History:  The patient's family history includes Diabetes in his mother; Heart attack in his father; Stroke in his father and mother.    ROS:  Please see the history of present illness.   Otherwise, review of systems is positive for pressure, shortness of breath, cough, snoring, depression, anxiety.   All other systems are reviewed and negative.   PHYSICAL EXAM: VS:  BP 120/74   Pulse 68   Ht '5\' 5"'  (1.651 m)   Wt 210 lb 3.2 oz (95.3 kg)   BMI 34.98 kg/m  , BMI Body mass index is 34.98 kg/m. GEN: Well nourished, well developed, in no acute distress  HEENT: normal  Neck: no JVD, carotid bruits, or masses Cardiac: RRR; no murmurs, rubs, or gallops,no edema  Respiratory:  clear to auscultation bilaterally, normal work of breathing GI: soft, nontender, nondistended, + BS MS: no deformity or atrophy  Skin: warm and dry, device site well healed Neuro:  Strength and sensation are intact Psych: euthymic mood, full affect  EKG:  EKG is ordered today. Personal review of the ekg ordered shows V pacing  Personal review of the device interrogation today. Results in [facility]: 01/18/2017: Hemoglobin 12.0; Platelets 154 02/01/2017: BNP 166.0; BUN 22; Creatinine, Ser 1.91;  Potassium 4.8; Sodium 146    Lipid Panel     Component Value Date/Time   CHOL 129 12/10/2012 0115   TRIG 155 12/10/2012 0115   HDL 31 (L) 12/10/2012 0115   VLDL 31 12/10/2012 0115   LDLCALC 67 12/10/2012 0115     Wt Readings from Last 3 Encounters:  07/09/17 210 lb 3.2 oz (95.3 kg)  02/25/17 220 lb (99.8 kg)  02/01/17 233 lb 1.3 oz (105.7 kg)      Other studies Reviewed: Additional studies/ records that were reviewed today include: TTE 11/15/16 Review of the above records today demonstrates:  LVEF 55%, apical hypokinesis Impaired diastolic relaxation normal left atrial size mild to moderate mitral regurgitation Aortic sclerosis without stenosis Normal right heart pressures  ASSESSMENT AND PLAN:  1.  Sick sinus syndrome: This post  Medtronic dual-chamber pacemaker.  Device is at [facility].  We Juston Goheen plan for generator change.  Risks and benefits were discussed.  Risks include bleeding, and infection among others.  The patient understands risks and is agreed to the procedure.  2.  Coronary artery disease status post CABG with chronic stable angina: Currently no chest pain.  Continue with current management.  3.  Hypertension: Blood pressure well controlled.  No changes.  4.  Hyperlipidemia: Continue atorvastatin   Current medicines are reviewed at length with the patient today.   The patient does not have concerns regarding his medicines.  The following changes were made today: None  Labs/ tests ordered today include:  Orders Placed This Encounter  Procedures  . EKG 12-Lead   Disposition:   FU with [facility] 3 months  Signed, Dewell Monnier Meredith Leeds, MD  07/09/2017 12:14 PM     [address] [place] [address] [phone] (office) [phone] (fax)

## 2017-07-10 ENCOUNTER — Encounter: Payer: Self-pay | Admitting: *Deleted

## 2017-07-10 ENCOUNTER — Telehealth: Payer: Self-pay | Admitting: *Deleted

## 2017-07-10 ENCOUNTER — Telehealth: Payer: Self-pay | Admitting: Cardiology

## 2017-07-10 NOTE — Telephone Encounter (Signed)
Follow Up:    Charles Knox from [facility],Please call.

## 2017-07-10 NOTE — Telephone Encounter (Signed)
Discussed pt having pre procedure blood work through assisted living facility prior to his generator change next month. Will fax order to their facility and Burna MortimerWanda will fax back once performed.

## 2017-07-10 NOTE — Telephone Encounter (Signed)
Pt seen yesterday, needs generator change, but have to coordinate w/ his nurse through assisted living facility to schedule. Dr. Elberta Fortisamnitz is out of town for several weeks in July so placing pt on Dr. Lubertha Basqueaylor's schedule for gen change, [facility]  This was discussed w/ his nurse, Boneta LucksJenny.  Spoke to RN/caregiver/coordinator. Pt scheduled for generator change on 7/15 w/ Dr. Ladona Ridgelaylor. Made aware to arrive at 7:30 a.m that morning. Letter of instructions faxed to Burna MortimerWanda (care coordinator) at [phone]. Wound check appt scheduled for 7/23. Tyrese, RN verbalized understanding and agreeable to plan.

## 2017-08-05 ENCOUNTER — Encounter (HOSPITAL_COMMUNITY): Payer: Self-pay | Admitting: Internal Medicine

## 2017-08-05 ENCOUNTER — Ambulatory Visit (HOSPITAL_COMMUNITY)
Admission: RE | Admit: 2017-08-05 | Discharge: 2017-08-05 | Disposition: A | Discharge status: Home / Self Care | Payer: Medicare Other | Source: Ambulatory Visit | Attending: Internal Medicine | Admitting: Internal Medicine

## 2017-08-05 ENCOUNTER — Encounter (HOSPITAL_COMMUNITY)
Admission: RE | Disposition: A | Discharge status: Home / Self Care | Payer: Self-pay | Source: Ambulatory Visit | Attending: Internal Medicine

## 2017-08-05 ENCOUNTER — Other Ambulatory Visit: Payer: Self-pay

## 2017-08-05 DIAGNOSIS — I110 Hypertensive heart disease with heart failure: Secondary | ICD-10-CM | POA: Diagnosis not present

## 2017-08-05 DIAGNOSIS — I2510 Atherosclerotic heart disease of native coronary artery without angina pectoris: Secondary | ICD-10-CM | POA: Insufficient documentation

## 2017-08-05 DIAGNOSIS — E785 Hyperlipidemia, unspecified: Secondary | ICD-10-CM | POA: Diagnosis not present

## 2017-08-05 DIAGNOSIS — Z8249 Family history of ischemic heart disease and other diseases of the circulatory system: Secondary | ICD-10-CM | POA: Insufficient documentation

## 2017-08-05 DIAGNOSIS — Z006 Encounter for examination for normal comparison and control in clinical research program: Secondary | ICD-10-CM | POA: Insufficient documentation

## 2017-08-05 DIAGNOSIS — J449 Chronic obstructive pulmonary disease, unspecified: Secondary | ICD-10-CM | POA: Insufficient documentation

## 2017-08-05 DIAGNOSIS — I495 Sick sinus syndrome: Secondary | ICD-10-CM | POA: Diagnosis present

## 2017-08-05 DIAGNOSIS — Z79899 Other long term (current) drug therapy: Secondary | ICD-10-CM | POA: Insufficient documentation

## 2017-08-05 DIAGNOSIS — Z823 Family history of stroke: Secondary | ICD-10-CM | POA: Diagnosis not present

## 2017-08-05 DIAGNOSIS — Z9889 Other specified postprocedural states: Secondary | ICD-10-CM | POA: Insufficient documentation

## 2017-08-05 DIAGNOSIS — Z7982 Long term (current) use of aspirin: Secondary | ICD-10-CM | POA: Insufficient documentation

## 2017-08-05 DIAGNOSIS — Z79890 Hormone replacement therapy (postmenopausal): Secondary | ICD-10-CM | POA: Diagnosis not present

## 2017-08-05 DIAGNOSIS — F259 Schizoaffective disorder, unspecified: Secondary | ICD-10-CM | POA: Diagnosis not present

## 2017-08-05 DIAGNOSIS — Z880 Allergy status to penicillin: Secondary | ICD-10-CM | POA: Insufficient documentation

## 2017-08-05 DIAGNOSIS — I5032 Chronic diastolic (congestive) heart failure: Secondary | ICD-10-CM | POA: Diagnosis not present

## 2017-08-05 DIAGNOSIS — G4733 Obstructive sleep apnea (adult) (pediatric): Secondary | ICD-10-CM | POA: Diagnosis not present

## 2017-08-05 DIAGNOSIS — Z951 Presence of aortocoronary bypass graft: Secondary | ICD-10-CM | POA: Diagnosis not present

## 2017-08-05 DIAGNOSIS — I491 Atrial premature depolarization: Secondary | ICD-10-CM | POA: Insufficient documentation

## 2017-08-05 DIAGNOSIS — E138 Other specified diabetes mellitus with unspecified complications: Secondary | ICD-10-CM | POA: Insufficient documentation

## 2017-08-05 DIAGNOSIS — F17200 Nicotine dependence, unspecified, uncomplicated: Secondary | ICD-10-CM | POA: Diagnosis not present

## 2017-08-05 DIAGNOSIS — Z7951 Long term (current) use of inhaled steroids: Secondary | ICD-10-CM | POA: Diagnosis not present

## 2017-08-05 DIAGNOSIS — E039 Hypothyroidism, unspecified: Secondary | ICD-10-CM | POA: Diagnosis not present

## 2017-08-05 DIAGNOSIS — Z905 Acquired absence of kidney: Secondary | ICD-10-CM | POA: Insufficient documentation

## 2017-08-05 DIAGNOSIS — Z7901 Long term (current) use of anticoagulants: Secondary | ICD-10-CM | POA: Insufficient documentation

## 2017-08-05 HISTORY — PX: PPM GENERATOR CHANGEOUT: EP1233

## 2017-08-05 LAB — GLUCOSE, CAPILLARY: Glucose-Capillary: 80 mg/dL (ref 70–99)

## 2017-08-05 LAB — SURGICAL PCR SCREEN
MRSA, PCR: NEGATIVE
Staphylococcus aureus: POSITIVE — AB

## 2017-08-05 SURGERY — PPM GENERATOR CHANGEOUT

## 2017-08-05 MED ORDER — VANCOMYCIN HCL IN DEXTROSE 1-5 GM/200ML-% IV SOLN
1000.0000 mg | INTRAVENOUS | Status: AC
  Administered 2017-08-05: 1000 mg via INTRAVENOUS
  Filled 2017-08-05: qty 200

## 2017-08-05 MED ORDER — LIDOCAINE HCL (PF) 1 % IJ SOLN
INTRAMUSCULAR | Status: DC | PRN
  Administered 2017-08-05 (×2): 30 mL

## 2017-08-05 MED ORDER — BUPIVACAINE HCL (PF) 0.25 % IJ SOLN: INTRAMUSCULAR | Status: DC | PRN

## 2017-08-05 MED ORDER — MIDAZOLAM HCL 5 MG/5ML IJ SOLN
INTRAMUSCULAR | Status: DC | PRN
  Administered 2017-08-05: 1 mg via INTRAVENOUS

## 2017-08-05 MED ORDER — SODIUM CHLORIDE 0.9 % IV SOLN: INTRAVENOUS | Status: DC

## 2017-08-05 MED ORDER — ACETAMINOPHEN 325 MG PO TABS: 325.0000 mg | ORAL_TABLET | ORAL | Status: DC | PRN

## 2017-08-05 MED ORDER — SODIUM CHLORIDE 0.9 % IV SOLN
80.0000 mg | INTRAVENOUS | Status: AC
  Administered 2017-08-05: 80 mg
  Filled 2017-08-05: qty 2

## 2017-08-05 MED ORDER — MUPIROCIN 2 % EX OINT
TOPICAL_OINTMENT | CUTANEOUS | Status: AC
  Administered 2017-08-05: 1
  Filled 2017-08-05: qty 22

## 2017-08-05 MED ORDER — FENTANYL CITRATE (PF) 100 MCG/2ML IJ SOLN
INTRAMUSCULAR | Status: AC
Start: 2017-08-05 — End: ?
  Filled 2017-08-05: qty 2

## 2017-08-05 MED ORDER — ONDANSETRON HCL 4 MG/2ML IJ SOLN: 4.0000 mg | Freq: Four times a day (QID) | INTRAMUSCULAR | Status: DC | PRN

## 2017-08-05 MED ORDER — VANCOMYCIN HCL IN DEXTROSE 1-5 GM/200ML-% IV SOLN
INTRAVENOUS | Status: AC
  Filled 2017-08-05: qty 200

## 2017-08-05 MED ORDER — FENTANYL CITRATE (PF) 100 MCG/2ML IJ SOLN
INTRAMUSCULAR | Status: DC | PRN
  Administered 2017-08-05: 12.5 ug via INTRAVENOUS

## 2017-08-05 MED ORDER — MIDAZOLAM HCL 5 MG/5ML IJ SOLN
INTRAMUSCULAR | Status: AC
  Filled 2017-08-05: qty 5

## 2017-08-05 MED ORDER — HEPARIN (PORCINE) IN NACL 1000-0.9 UT/500ML-% IV SOLN
INTRAVENOUS | Status: DC | PRN
  Administered 2017-08-05: 500 mL

## 2017-08-05 MED ORDER — LIDOCAINE HCL (PF) 1 % IJ SOLN
INTRAMUSCULAR | Status: AC
  Filled 2017-08-05: qty 60

## 2017-08-05 MED ORDER — SODIUM CHLORIDE 0.9 % IV SOLN
INTRAVENOUS | Status: AC
  Filled 2017-08-05: qty 2

## 2017-08-05 SURGICAL SUPPLY — 6 items
CABLE SURGICAL S-101-97-12 (CABLE) ×3 IMPLANT
IPG PACE AZUR XT DR MRI W1DR01 (Pacemaker) ×1 IMPLANT
NEEDLE PERC 18GX7CM (NEEDLE) ×3 IMPLANT
PACE AZURE XT DR MRI W1DR01 (Pacemaker) ×3 IMPLANT
SHEATH PINNACLE 5F 10CM (SHEATH) ×3 IMPLANT
TRAY PACEMAKER INSERTION (PACKS) ×3 IMPLANT

## 2017-08-05 NOTE — Discharge Instructions (Signed)
Pacemaker Battery Change, Care After This sheet gives you information about how to care for yourself after your procedure. Your health care provider may also give you more specific instructions. If you have problems or questions, contact your health care provider. What can I expect after the procedure? After your procedure, it is common to have:  Pain or soreness at the site where the pacemaker was inserted.  Swelling at the site where the pacemaker was inserted.  Follow these instructions at home: Incision care  Keep the incision clean and dry. ? Do not take baths, swim, or use a hot tub until your health care provider approves. ? You may remove the outer dressing tomorrow; leave steri-strips intact; do not get site wet for one week ? Pat the area dry with a clean towel. Do not rub the area. This may cause bleeding.  Follow instructions from your health care provider about how to take care of your incision. Make sure you: ? Wash your hands with soap and water before you change your bandage (dressing). If soap and water are not available, use hand sanitizer. ? Change your dressing as told by your health care provider. ? Leave stitches (sutures), skin glue, or adhesive strips in place. These skin closures may need to stay in place for 2 weeks or longer. If adhesive strip edges start to loosen and curl up, you may trim the loose edges. Do not remove adhesive strips completely unless your health care provider tells you to do that.  Check your incision area every day for signs of infection. Check for: ? More redness, swelling, or pain. ? More fluid or blood. ? Warmth. ? Pus or a bad smell. Activity  Do not lift anything that is heavier than 10 lb (4.5 kg) until your health care provider says it is okay to do so.  For the first 2 weeks, or as long as told by your health care provider: ? Avoid lifting your left arm higher than your shoulder. ? Be gentle when you move your arms over your  head. It is okay to raise your arm to comb your hair. ? Avoid strenuous exercise.  Ask your health care provider when it is okay to: ? Resume your normal activities. ? Return to work or school. ? Resume sexual activity. Eating and drinking  Eat a heart-healthy diet. This should include plenty of fresh fruits and vegetables, whole grains, low-fat dairy products, and lean protein like chicken and fish.  Limit alcohol intake to no more than 1 drink a day for non-pregnant women and 2 drinks a day for men. One drink equals 12 oz of beer, 5 oz of wine, or 1 oz of hard liquor.  Check ingredients and nutrition facts on packaged foods and beverages. Avoid the following types of food: ? Food that is high in salt (sodium). ? Food that is high in saturated fat, like full-fat dairy or red meat. ? Food that is high in trans fat, like fried food. ? Food and drinks that are high in sugar. Lifestyle  Do not use any products that contain nicotine or tobacco, such as cigarettes and e-cigarettes. If you need help quitting, ask your health care provider.  Take steps to manage and control your weight.  Get regular exercise. Aim for 150 minutes of moderate-intensity exercise (such as walking or yoga) or 75 minutes of vigorous exercise (such as running or swimming) each week.  Manage other health problems, such as diabetes or high blood pressure. Ask  your health care provider how you can manage these conditions. General instructions  Do not drive for 24 hours after your procedure if you were given a medicine to help you relax (sedative).  Take over-the-counter and prescription medicines only as told by your health care provider.  Avoid putting pressure on the area where the pacemaker was placed.  If you need an MRI after your pacemaker has been placed, be sure to tell the health care provider who orders the MRI that you have a pacemaker.  Avoid close and prolonged exposure to electrical devices that  have strong magnetic fields. These include: ? Cell phones. Avoid keeping them in a pocket near the pacemaker, and try using the ear opposite the pacemaker. ? MP3 players. ? Household appliances, like microwaves. ? Metal detectors. ? Electric generators. ? High-tension wires.  Keep all follow-up visits as directed by your health care provider. This is important. Contact a health care provider if:  You have pain at the incision site that is not relieved by over-the-counter or prescription medicines.  You have any of these around your incision site or coming from it: ? More redness, swelling, or pain. ? Fluid or blood. ? Warmth to the touch. ? Pus or a bad smell.  You have a fever.  You feel brief, occasional palpitations, light-headedness, or any symptoms that you think might be related to your heart. Get help right away if:  You experience chest pain that is different from the pain at the pacemaker site.  You develop a red streak that extends above or below the incision site.  You experience shortness of breath.  You have palpitations or an irregular heartbeat.  You have light-headedness that does not go away quickly.  You faint or have dizzy spells.  Your pulse suddenly drops or increases rapidly and does not return to normal.  You begin to gain weight and your legs and ankles swell. Summary  After your procedure, it is common to have pain, soreness, and some swelling where the pacemaker was inserted.  Make sure to keep your incision clean and dry. Follow instructions from your health care provider about how to take care of your incision.  Check your incision every day for signs of infection, such as more pain or swelling, pus or a bad smell, warmth, or leaking fluid and blood.  Avoid strenuous exercise and lifting your left arm higher than your shoulder for 2 weeks, or as long as told by your health care provider. This information is not intended to replace advice  given to you by your health care provider. Make sure you discuss any questions you have with your health care provider. Document Released: 10/29/2012 Document Revised: 12/01/2015 Document Reviewed: 12/01/2015 Elsevier Interactive Patient Education  2017 [employer].

## 2017-08-05 NOTE — Progress Notes (Signed)
Patients 5 fr right femoral arterial sheath was pulled per order. Manual pressure was held for 10 minutes. Patient tolerated pull well. Sheath site during and post sheath pull was clean, dry, intact and had no sign of a hematoma. Patients VS remained WNL's during and post sheath pull. Patients bedrest began at 1105 and ends in 1 hour at 1205. Vascular site assessment was a level 0. Sheath removal site was dressed with a gauze dressing and was clean, dry, and intact. Patient was educated about post sheath pull management and stated he understood and had no questions.

## 2017-08-05 NOTE — H&P (Signed)
Electrophysiology Office Note   Date:  07/09/2017   ID:  Charles Knox, DOB May 02, 1955, MRN [id number]  PCP:  Charles Number, MD     Cardiologist:  Charles Knox Primary Electrophysiologist:  Charles Haw, MD       No chief complaint on file.    History of Present Illness: Charles Knox is a 62 y.o. male who is being seen today for the evaluation of pacemaker at the request of Charles Knox. Presenting today for electrophysiology evaluation.  He has a history of chronic diastolic heart failure, hypertensive heart disease, coronary artery disease with stable angina, and is status post pacemaker implant for sick sinus syndrome.  He also has COPD with previous respiratory failure requiring mechanical ventilation.  He had a CABG in 2013.  Today, denies symptoms of palpitations, chest pain, shortness of breath, orthopnea, PND, lower extremity edema, claudication, dizziness, presyncope, syncope, bleeding, or neurologic sequela. The patient is tolerating medications without difficulties.  Overall he is doing well.  He does have a Medtronic dual-chamber pacemaker implanted that is reached ERI and is ready for device generator change.  Otherwise no complaints.       Past Medical History:  Diagnosis Date  . Agitation 12/29/2013  . Angina pectoris ([place]) 11/22/2016  . Asthma 11/22/2016  . Chest pain, unspecified 11/22/2016  . COPD (chronic obstructive pulmonary disease) ([place]) 11/22/2016  . Coronary disease 11/22/2016   Overview:  CABG 2013  . 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 Hypercholesterolemia 11/22/2016  . Hypertension 11/22/2016  . Hypothyroid 11/22/2016  . Obstructive sleep apnea 11/22/2016  . PAC (premature atrial contraction) 09/21/2016  . Pacemaker reprogramming/check 09/21/2016  . Schizoaffective disorder ([place]) 12/28/2013  . Sick sinus syndrome ([place]) 11/22/2016  . SOB (shortness of breath) 11/22/2016  . 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  .  SSS (sick sinus syndrome) ([place]) 09/21/2016  . Unstable angina ([place]) 11/22/2016        Past Surgical History:  Procedure Laterality Date  . CARDIAC PACEMAKER PLACEMENT    . NEPHRECTOMY             Current Outpatient Medications  Medication Sig Dispense Refill  . arformoterol (BROVANA) 15 MCG/2ML NEBU Take 15 mcg by nebulization 2 (two) times daily.    . ARIPiprazole ER (ABILIFY MAINTENA) 400 MG PRSY prefilled syringe Inject 400 mg into the muscle every 28 (twenty-eight) days.    Marland Kitchen aspirin EC 81 MG tablet Take 81 mg daily by mouth.    Marland Kitchen atorvastatin (LIPITOR) 40 MG tablet Take 40 mg daily by mouth.    . budesonide-formoterol (SYMBICORT) 160-4.5 MCG/ACT inhaler Inhale 2 puffs 2 (two) times daily into the lungs.    . clopidogrel (PLAVIX) 75 MG tablet Take by mouth.    . Cyanocobalamin 1000 MCG/ML KIT Inject 1,000 mcg as directed every 28 (twenty-eight) days.    . Glycopyrrolate-Formoterol (BEVESPI AEROSPHERE) 9-4.8 MCG/ACT AERO Inhale 2 puffs into the lungs 2 (two) times daily.    . haloperidol (HALDOL) 2 MG/ML solution Take 5 mLs by mouth at bedtime.    Marland Kitchen ipratropium-albuterol (DUONEB) 0.5-2.5 (3) MG/3ML SOLN Take 3 mLs every 6 (six) hours as needed by nebulization.    . isosorbide mononitrate (IMDUR) 60 MG 24 hr tablet Take 60 mg by mouth.    . levothyroxine (SYNTHROID, LEVOTHROID) 75 MCG tablet Take 50 mcg by mouth.     . metoprolol tartrate (LOPRESSOR) 25 MG tablet Take 25 mg 2 (  two) times daily by mouth.     . mirabegron ER (MYRBETRIQ) 50 MG TB24 tablet Take 50 mg daily by mouth.    . nitroGLYCERIN (NITROSTAT) 0.4 MG SL tablet Place 0.4 mg under the tongue.    Marland Kitchen Olopatadine HCl (PATADAY) 0.2 % SOLN Place 1 drop into both eyes daily.    Marland Kitchen omega-3 acid ethyl esters (LOVAZA) 1 g capsule Take 4 (four) times daily by mouth.    . pantoprazole (PROTONIX) 40 MG tablet Take 40 mg 2 (two) times daily by mouth.     . potassium chloride (MICRO-K) 10 MEQ  CR capsule Take 10 mEq by mouth daily.     . sacubitril-valsartan (ENTRESTO) 24-26 MG Take 1 tablet by mouth 2 (two) times daily.    . sertraline (ZOLOFT) 100 MG tablet Take 1 tablet by mouth every morning    . sitaGLIPtin (JANUVIA) 100 MG tablet Take 100 mg by mouth daily.    . tamsulosin (FLOMAX) 0.4 MG CAPS capsule Take 0.4 mg daily by mouth.    . torsemide (DEMADEX) 20 MG tablet Take 20 mg by mouth daily.    . Vitamin D, Ergocalciferol, (DRISDOL) 50000 units CAPS capsule Take 50,000 Units every 7 (seven) days by mouth.     No current facility-administered medications for this visit.     Allergies:   Penicillins   Social History:  The patient  reports that he has been smoking.  He has never used smokeless tobacco. He reports that he does not drink alcohol or use drugs.   Family History:  The patient's family history includes Diabetes in his mother; Heart attack in his father; Stroke in his father and mother.    ROS:  Please see the history of present illness.   Otherwise, review of systems is positive for pressure, shortness of breath, cough, snoring, depression, anxiety.   All other systems are reviewed and negative.   PHYSICAL EXAM: VS:  BP 120/74   Pulse 68   Ht 5' 5" (1.651 m)   Wt 210 lb 3.2 oz (95.3 kg)   BMI 34.98 kg/m  , BMI Body mass index is 34.98 kg/m. GEN: Well nourished, well developed, in no acute distress  HEENT: normal  Neck: no JVD, carotid bruits, or masses Cardiac: RRR; no murmurs, rubs, or gallops,no edema  Respiratory:  clear to auscultation bilaterally, normal work of breathing GI: soft, nontender, nondistended, + BS MS: no deformity or atrophy  Skin: warm and dry, device site well healed Neuro:  Strength and sensation are intact Psych: euthymic mood, full affect  EKG:  EKG is ordered today. Personal review of the ekg ordered shows V pacing  Personal review of the device interrogation today. Results in [facility]: 01/18/2017: Hemoglobin 12.0; Platelets 154 02/01/2017: BNP 166.0; BUN 22; Creatinine, Ser 1.91; Potassium 4.8; Sodium 146    Lipid Panel  Labs(Brief)          Component Value Date/Time   CHOL 129 12/10/2012 0115   TRIG 155 12/10/2012 0115   HDL 31 (L) 12/10/2012 0115   VLDL 31 12/10/2012 0115   LDLCALC 67 12/10/2012 0115          Wt Readings from Last 3 Encounters:  07/09/17 210 lb 3.2 oz (95.3 kg)  02/25/17 220 lb (99.8 kg)  02/01/17 233 lb 1.3 oz (105.7 kg)      Other studies Reviewed: Additional studies/ records that were reviewed today include: TTE 11/15/16 Review of the above records  today demonstrates:  LVEF 55%, apical hypokinesis Impaired diastolic relaxation normal left atrial size mild to moderate mitral regurgitation Aortic sclerosis without stenosis Normal right heart pressures  ASSESSMENT AND PLAN:  1.  Sick sinus syndrome: This post Medtronic dual-chamber pacemaker.  Device is at [facility].  We will plan for generator change.  Risks and benefits were discussed.  Risks include bleeding, and infection among others.  The patient understands risks and is agreed to the procedure.  2.  Coronary artery disease status post CABG with chronic stable angina: Currently no chest pain.  Continue with current management.  3.  Hypertension: Blood pressure well controlled.  No changes.  4.  Hyperlipidemia: Continue atorvastatin   Current medicines are reviewed at length with the patient today.   The patient does not have concerns regarding his medicines.  The following changes were made today: None  Labs/ tests ordered today include:     Orders Placed This Encounter  Procedures  . EKG 12-Lead   Disposition:   FU with [facility] 3 months  Signed, Will Meredith Leeds, MD  07/09/2017 12:14 PM     EP Attending  Patient seen and examined. Agree with the findings as noted above. He has symptomatic sinus node dysfunction s/p PPM  insertion and has reached ERI. We will plan to proceed with PM generator change out. The risks/benefits/goals/expectations of the procedure were reviewed and he wishes to proceed.  Mikle Bosworth.D.

## 2017-08-13 ENCOUNTER — Ambulatory Visit (INDEPENDENT_AMBULATORY_CARE_PROVIDER_SITE_OTHER): Payer: Medicare Other | Admitting: *Deleted

## 2017-08-13 DIAGNOSIS — I495 Sick sinus syndrome: Secondary | ICD-10-CM

## 2017-08-13 LAB — CUP PACEART INCLINIC DEVICE CHECK
Battery Remaining Longevity: 161 mo
Brady Statistic RA Percent Paced: 91.1 %
Implantable Lead Implant Date: 20050804
Implantable Lead Location: 753859
Implantable Lead Location: 753860
Implantable Lead Model: 4076
Implantable Pulse Generator Implant Date: 20190715
Lead Channel Pacing Threshold Amplitude: 0.75 V
Lead Channel Pacing Threshold Pulse Width: 0.4 ms
Lead Channel Sensing Intrinsic Amplitude: 3.4 mV
Lead Channel Sensing Intrinsic Amplitude: 8.1 mV
Lead Channel Setting Pacing Amplitude: 2 V
Lead Channel Setting Pacing Amplitude: 2.5 V
Lead Channel Setting Pacing Pulse Width: 0.4 ms
MDC IDC LEAD IMPLANT DT: 20050804
MDC IDC MSMT LEADCHNL RA IMPEDANCE VALUE: 361 Ohm
MDC IDC MSMT LEADCHNL RA PACING THRESHOLD PULSEWIDTH: 0.4 ms
MDC IDC MSMT LEADCHNL RV IMPEDANCE VALUE: 475 Ohm
MDC IDC MSMT LEADCHNL RV PACING THRESHOLD AMPLITUDE: 1.5 V
MDC IDC SESS DTM: 20190723154053
MDC IDC SET LEADCHNL RV SENSING SENSITIVITY: 0.9 mV
MDC IDC STAT BRADY RV PERCENT PACED: 0.1 %

## 2017-08-13 NOTE — Progress Notes (Signed)
Wound check appointment. Steri-strips removed prior to OV. Wound without redness or edema. Incision edges approximated, wound well healed. Normal device function. Thresholds, sensing, and impedances consistent with implant measurements. Device programmed at w/ auto capture on . Histogram distribution appropriate for patient and level of activity. No mode switches or high ventricular rates noted. Patient educated about wound care, arm mobility. ROV 11/26/2017 w/ WC

## 2017-11-26 ENCOUNTER — Encounter: Payer: Self-pay | Admitting: Cardiology

## 2017-11-26 ENCOUNTER — Ambulatory Visit (INDEPENDENT_AMBULATORY_CARE_PROVIDER_SITE_OTHER): Payer: Medicare Other | Admitting: Cardiology

## 2017-11-26 VITALS — BP 72/50 | HR 76 | Ht 65.0 in | Wt 196.8 lb

## 2017-11-26 DIAGNOSIS — I495 Sick sinus syndrome: Secondary | ICD-10-CM | POA: Diagnosis not present

## 2017-11-26 DIAGNOSIS — E785 Hyperlipidemia, unspecified: Secondary | ICD-10-CM | POA: Diagnosis not present

## 2017-11-26 DIAGNOSIS — I25708 Atherosclerosis of coronary artery bypass graft(s), unspecified, with other forms of angina pectoris: Secondary | ICD-10-CM | POA: Diagnosis not present

## 2017-11-26 DIAGNOSIS — I10 Essential (primary) hypertension: Secondary | ICD-10-CM

## 2017-11-26 LAB — CUP PACEART INCLINIC DEVICE CHECK
Battery Remaining Longevity: 162 mo
Brady Statistic AP VS Percent: 68.73 %
Brady Statistic AS VP Percent: 0.01 %
Brady Statistic AS VS Percent: 31.19 %
Brady Statistic RV Percent Paced: 0.09 %
Implantable Lead Implant Date: 20050804
Implantable Lead Location: 753859
Implantable Lead Model: 4076
Implantable Lead Model: 4076
Lead Channel Impedance Value: 285 Ohm
Lead Channel Impedance Value: 342 Ohm
Lead Channel Impedance Value: 361 Ohm
Lead Channel Pacing Threshold Amplitude: 1.5 V
Lead Channel Pacing Threshold Pulse Width: 0.4 ms
Lead Channel Sensing Intrinsic Amplitude: 7 mV
Lead Channel Setting Pacing Amplitude: 1.5 V
Lead Channel Setting Pacing Amplitude: 3 V
Lead Channel Setting Pacing Pulse Width: 0.4 ms
Lead Channel Setting Sensing Sensitivity: 0.9 mV
MDC IDC LEAD IMPLANT DT: 20050804
MDC IDC LEAD LOCATION: 753860
MDC IDC MSMT BATTERY VOLTAGE: 3.17 V
MDC IDC MSMT LEADCHNL RA PACING THRESHOLD AMPLITUDE: 0.75 V
MDC IDC MSMT LEADCHNL RA SENSING INTR AMPL: 2 mV
MDC IDC MSMT LEADCHNL RV IMPEDANCE VALUE: 399 Ohm
MDC IDC MSMT LEADCHNL RV PACING THRESHOLD PULSEWIDTH: 0.4 ms
MDC IDC PG IMPLANT DT: 20190715
MDC IDC SESS DTM: 20191105134205
MDC IDC STAT BRADY AP VP PERCENT: 0.07 %
MDC IDC STAT BRADY RA PERCENT PACED: 68.89 %

## 2017-11-26 NOTE — Progress Notes (Signed)
Electrophysiology Office Note   Date:  11/26/2017   ID:  Charles Knox, DOB March 18, 1955, MRN [id number]  PCP:  Shelbie Ammons, MD  Cardiologist:  Dulce Sellar Primary Electrophysiologist:  Regan Lemming, MD    No chief complaint on file.    History of Present Illness: Charles Knox is a 62 y.o. male who is being seen today for the evaluation of pacemaker at the request of Norman Herrlich. Presenting today for electrophysiology evaluation.  He has a history of chronic diastolic heart failure, hypertensive heart disease, coronary artery disease with stable angina, and is status post pacemaker implant for sick sinus syndrome.  He also has COPD with previous respiratory failure requiring mechanical ventilation.  He had a CABG in 2013.  He had his Medtronic dual-chamber pacemaker generator change on 08/05/2017.  Today, denies symptoms of palpitations, chest pain, shortness of breath, orthopnea, PND, lower extremity edema, claudication, dizziness, presyncope, syncope, bleeding, or neurologic sequela. The patient is tolerating medications without difficulties.  Overall he is feeling well.  He has had low blood pressures today in clinic.  His blood pressures at his facility this morning were normal.  He feels well without major complaint.   Past Medical History:  Diagnosis Date  . Agitation 12/29/2013  . Angina pectoris (HCC) 11/22/2016  . Asthma 11/22/2016  . Chest pain, unspecified 11/22/2016  . COPD (chronic obstructive pulmonary disease) (HCC) 11/22/2016  . Coronary disease 11/22/2016   Overview:  CABG 2013  . 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 Hypercholesterolemia 11/22/2016  . Hypertension 11/22/2016  . Hypothyroid 11/22/2016  . Obstructive sleep apnea 11/22/2016  . PAC (premature atrial contraction) 09/21/2016  . Pacemaker reprogramming/check 09/21/2016  . Schizoaffective disorder (HCC) 12/28/2013  . Sick sinus syndrome (HCC) 11/22/2016  . SOB (shortness of breath) 11/22/2016  .  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  . SSS (sick sinus syndrome) (HCC) 09/21/2016  . Unstable angina (HCC) 11/22/2016   Past Surgical History:  Procedure Laterality Date  . CARDIAC PACEMAKER PLACEMENT    . NEPHRECTOMY    . PPM GENERATOR CHANGEOUT N/A 08/05/2017   Procedure: PPM GENERATOR CHANGEOUT;  Surgeon: Marinus Maw, MD;  Location: [facility] INVASIVE CV LAB;  Service: Cardiovascular;  Laterality: N/A;     Current Outpatient Medications  Medication Sig Dispense Refill  . albuterol (PROVENTIL HFA;VENTOLIN HFA) 108 (90 Base) MCG/ACT inhaler Inhale 2 puffs into the lungs every 4 (four) hours as needed for wheezing or shortness of breath.    Marland Kitchen albuterol (PROVENTIL) (2.5 MG/3ML) 0.083% nebulizer solution Take 2.5 mg by nebulization every 6 (six) hours as needed for wheezing or shortness of breath.    . ARIPiprazole ER (ABILIFY MAINTENA) 400 MG PRSY prefilled syringe Inject 400 mg into the muscle every 28 (twenty-eight) days.    Marland Kitchen aspirin 81 MG chewable tablet Chew 81 mg by mouth daily.    Marland Kitchen atorvastatin (LIPITOR) 40 MG tablet Take 40 mg daily by mouth.    . budesonide-formoterol (SYMBICORT) 160-4.5 MCG/ACT inhaler Inhale 2 puffs 2 (two) times daily into the lungs.    . clopidogrel (PLAVIX) 75 MG tablet Take by mouth.    . furosemide (LASIX) 40 MG tablet Take 40 mg by mouth daily.    . haloperidol (HALDOL) 2 MG/ML solution Take 5 mg by mouth daily.     . haloperidol (HALDOL) 2 MG/ML solution Take 10 mg by mouth 2 (two) times daily.     . isosorbide mononitrate (IMDUR)  30 MG 24 hr tablet Take 30 mg by mouth daily.    Marland Kitchen levothyroxine (SYNTHROID, LEVOTHROID) 50 MCG tablet Take 75 mcg by mouth daily before breakfast.     . metFORMIN (GLUCOPHAGE) 500 MG tablet Take 500 mg by mouth 2 (two) times daily with a meal.    . metoprolol tartrate (LOPRESSOR) 25 MG tablet Take 25 mg by mouth 2 (two) times daily.     . mirabegron ER (MYRBETRIQ) 50 MG TB24 tablet Take 50 mg daily by mouth.    .  Omega-3 Fatty Acids (FISH OIL) 1000 MG CAPS Take 1,000 mg by mouth daily.    . pantoprazole (PROTONIX) 40 MG tablet Take 40 mg by mouth daily.     . potassium chloride (MICRO-K) 10 MEQ CR capsule Take 10 mEq by mouth daily.     . sacubitril-valsartan (ENTRESTO) 24-26 MG Take 1 tablet by mouth 2 (two) times daily.    . sertraline (ZOLOFT) 100 MG tablet Take 1 tablet by mouth every morning    . sitaGLIPtin (JANUVIA) 100 MG tablet Take 100 mg by mouth daily.    . tamsulosin (FLOMAX) 0.4 MG CAPS capsule Take 0.4 mg daily by mouth.    . torsemide (DEMADEX) 10 MG tablet Take 10 mg by mouth daily.     No current facility-administered medications for this visit.     Allergies:   Penicillins   Social History:  The patient  reports that he has been smoking. He has never used smokeless tobacco. He reports that he does not drink alcohol or use drugs.   Family History:  The patient's family history includes Diabetes in his mother; Heart attack in his father; Stroke in his father and mother.    ROS:  Please see the history of present illness.   Otherwise, review of systems is positive for snoring, depression, anxiety, back pain, muscle pain.   All other systems are reviewed and negative.   PHYSICAL EXAM: VS:  BP (!) 72/50   Pulse 76   Ht 5\' 5"  (1.651 m)   Wt 196 lb 12.8 oz (89.3 kg)   SpO2 97%   BMI 32.75 kg/m  , BMI Body mass index is 32.75 kg/m. GEN: Well nourished, well developed, in no acute distress  HEENT: normal  Neck: no JVD, carotid bruits, or masses Cardiac: RRR; no murmurs, rubs, or gallops,no edema  Respiratory:  clear to auscultation bilaterally, normal work of breathing GI: soft, nontender, nondistended, + BS MS: no deformity or atrophy  Skin: warm and dry, device site well healed Neuro:  Strength and sensation are intact Psych: euthymic mood, full affect  EKG:  EKG is ordered today. Personal review of the ekg ordered shows atrial paced, right bundle branch block, inferior  infarct, rate 76  Personal review of the device interrogation today. Results in Paceart   Recent Labs: 01/18/2017: Hemoglobin 12.0; Platelets 154 02/01/2017: BNP 166.0; BUN 22; Creatinine, Ser 1.91; Potassium 4.8; Sodium 146    Lipid Panel     Component Value Date/Time   CHOL 129 12/10/2012 0115   TRIG 155 12/10/2012 0115   HDL 31 (L) 12/10/2012 0115   VLDL 31 12/10/2012 0115   LDLCALC 67 12/10/2012 0115     Wt Readings from Last 3 Encounters:  11/26/17 196 lb 12.8 oz (89.3 kg)  07/09/17 210 lb 3.2 oz (95.3 kg)  02/25/17 220 lb (99.8 kg)      Other studies Reviewed: Additional studies/ records that were reviewed today include: TTE 11/15/16  Review of the above records today demonstrates:  LVEF 55%, apical hypokinesis Impaired diastolic relaxation normal left atrial size mild to moderate mitral regurgitation Aortic sclerosis without stenosis Normal right heart pressures  ASSESSMENT AND PLAN:  1.  Sick sinus syndrome: Status post generator changes Medtronic pacemaker 08/05/2017.  Is functioning appropriately.  No changes.  2.  Coronary artery disease status post CABG with chronic stable angina: No current chest pain.  Continue current management.  3.  Hypertension: Blood pressure is low today.  It is been normal on prior checks, and was normal this morning at his facility.  I have asked him to check his blood pressure later this afternoon.  He does not feel poorly.  He Phala Schraeder call Dr. Hulen Shouts office should it be low.  4.  Hyperlipidemia: Continue atorvastatin   Current medicines are reviewed at length with the patient today.   The patient does not have concerns regarding his medicines.  The following changes were made today: None  Labs/ tests ordered today include:  Orders Placed This Encounter  Procedures  . EKG 12-Lead   Case discussed with primary cardiology  Disposition:   FU with [facility] clinic 9 months  Signed, Lars Jeziorski Jorja Loa, MD  11/26/2017 10:08  AM     [facility] [address] [place] [address] [phone] (office) [phone] (fax)

## 2017-11-26 NOTE — Patient Instructions (Signed)
Medication Instructions:  Your physician recommends that you continue on your current medications as directed. Please refer to the Current Medication list given to you today.  If you need a refill on your cardiac medications before your next appointment, please call your pharmacy.   Lab work: None ordered  Testing/Procedures: None ordered   Follow-Up: Please schedule a follow up appointment in 3 months to see Dr. Dulce Sellar.  Remote monitoring is used to monitor your Pacemaker of ICD from home. This monitoring reduces the number of office visits required to check your device to one time per year. It allows [place] to keep an eye on the functioning of your device to ensure it is working properly. You are scheduled for a device check from home on 02/25/2018. You may send your transmission at any time that day. If you have a wireless device, the transmission will be sent automatically. After your physician reviews your transmission, you will receive a postcard with your next transmission date.  At [facility], you and your health needs are our priority.  As part of our continuing mission to provide you with exceptional heart care, we have created designated Provider Care Teams.  These Care Teams include your primary Cardiologist (physician) and Advanced Practice Providers (APPs -  Physician Assistants and Nurse Practitioners) who all work together to provide you with the care you need, when you need it. You will need a follow up appointment in 9 months.  Please call our office 2 months in advance to schedule this appointment.  You may see Will Jorja Loa, MD or one of the following Advanced Practice Providers on your designated Care Team:   Gypsy Balsam, NP . Francis Dowse, PA-C  Thank you for choosing [facility]!!   Dory Horn, RN [phone]

## 2018-02-23 NOTE — Progress Notes (Deleted)
Cardiology Office Note:    Date:  02/23/2018   ID:  Charles Knox, DOB Jan 25, 1955, MRN [id number]  PCP:  Shelbie Ammons, MD  Cardiologist:  Norman Herrlich, MD    Referring MD: Shelbie Ammons, MD    ASSESSMENT:    No diagnosis found. PLAN:    In order of problems listed above:  1. ***   Next appointment: ***   Medication Adjustments/Labs and Tests Ordered: Current medicines are reviewed at length with the patient today.  Concerns regarding medicines are outlined above.  No orders of the defined types were placed in this encounter.  No orders of the defined types were placed in this encounter.   No chief complaint on file.   History of Present Illness:    Charles Knox is a 63 y.o. male with a hx of COPD and previous respiratory failure and mechanical ventilation, CAD with CABG 2013, EF 44% in 2016 hypertension hyperlipidemia and Medtronic pacemaker for sick sinus syndrome  last seen 02/01/17. Compliance with diet, lifestyle and medications: *** Past Medical History:  Diagnosis Date  . Agitation 12/29/2013  . Angina pectoris (HCC) 11/22/2016  . Asthma 11/22/2016  . Chest pain, unspecified 11/22/2016  . COPD (chronic obstructive pulmonary disease) (HCC) 11/22/2016  . Coronary disease 11/22/2016   Overview:  CABG 2013  . 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 Hypercholesterolemia 11/22/2016  . Hypertension 11/22/2016  . Hypothyroid 11/22/2016  . Obstructive sleep apnea 11/22/2016  . PAC (premature atrial contraction) 09/21/2016  . Pacemaker reprogramming/check 09/21/2016  . Schizoaffective disorder (HCC) 12/28/2013  . Sick sinus syndrome (HCC) 11/22/2016  . SOB (shortness of breath) 11/22/2016  . 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  . SSS (sick sinus syndrome) (HCC) 09/21/2016  . Unstable angina (HCC) 11/22/2016    Past Surgical History:  Procedure Laterality Date  . CARDIAC PACEMAKER PLACEMENT    . NEPHRECTOMY    . PPM GENERATOR CHANGEOUT N/A  08/05/2017   Procedure: PPM GENERATOR CHANGEOUT;  Surgeon: Marinus Maw, MD;  Location: [facility] INVASIVE CV LAB;  Service: Cardiovascular;  Laterality: N/A;    Current Medications: No outpatient medications have been marked as taking for the 02/24/18 encounter (Appointment) with Baldo Daub, MD.     Allergies:   Penicillins   Social History   Socioeconomic History  . Marital status: Single    Spouse name: Not on file  . Number of children: Not on file  . Years of education: Not on file  . Highest education level: Not on file  Occupational History  . Not on file  Social Needs  . Financial resource strain: Not on file  . Food insecurity:    Worry: Not on file    Inability: Not on file  . Transportation needs:    Medical: Not on file    Non-medical: Not on file  Tobacco Use  . Smoking status: Current Some Day Smoker  . Smokeless tobacco: Never Used  Substance and Sexual Activity  . Alcohol use: No  . Drug use: No  . Sexual activity: Not on file  Lifestyle  . Physical activity:    Days per week: Not on file    Minutes per session: Not on file  . Stress: Not on file  Relationships  . Social connections:    Talks on phone: Not on file    Gets together: Not on file    Attends religious service: Not on file  Active member of club or organization: Not on file    Attends meetings of clubs or organizations: Not on file    Relationship status: Not on file  Other Topics Concern  . Not on file  Social History Narrative  . Not on file     Family History: The patient's ***family history includes Diabetes in his mother; Heart attack in his father; Stroke in his father and mother. ROS:   Please see the history of present illness.    All other systems reviewed and are negative.  EKGs/Labs/Other Studies Reviewed:    The following studies were reviewed today:  EKG:  EKG ordered today.  The ekg ordered today demonstrates ***  Recent Labs: No results found for requested  labs within last 8760 hours.  Recent Lipid Panel    Component Value Date/Time   CHOL 129 12/10/2012 0115   TRIG 155 12/10/2012 0115   HDL 31 (L) 12/10/2012 0115   VLDL 31 12/10/2012 0115   LDLCALC 67 12/10/2012 0115    Physical Exam:    VS:  There were no vitals taken for this visit.    Wt Readings from Last 3 Encounters:  11/26/17 196 lb 12.8 oz (89.3 kg)  07/09/17 210 lb 3.2 oz (95.3 kg)  02/25/17 220 lb (99.8 kg)     GEN: *** Well nourished, well developed in no acute distress HEENT: Normal NECK: No JVD; No carotid bruits LYMPHATICS: No lymphadenopathy CARDIAC: ***RRR, no murmurs, rubs, gallops RESPIRATORY:  Clear to auscultation without rales, wheezing or rhonchi  ABDOMEN: Soft, non-tender, non-distended MUSCULOSKELETAL:  No edema; No deformity  SKIN: Warm and dry NEUROLOGIC:  Alert and oriented x 3 PSYCHIATRIC:  Normal affect    Signed, Norman Herrlich, MD  02/23/2018 11:47 AM    [facility]

## 2018-02-24 ENCOUNTER — Ambulatory Visit: Payer: Medicare Other | Admitting: Cardiology

## 2018-02-25 ENCOUNTER — Ambulatory Visit: Payer: Medicare Other

## 2018-02-27 ENCOUNTER — Ambulatory Visit (INDEPENDENT_AMBULATORY_CARE_PROVIDER_SITE_OTHER): Payer: Medicare Other

## 2018-02-27 DIAGNOSIS — I495 Sick sinus syndrome: Secondary | ICD-10-CM

## 2018-02-27 DIAGNOSIS — I5032 Chronic diastolic (congestive) heart failure: Secondary | ICD-10-CM

## 2018-02-28 LAB — CUP PACEART REMOTE DEVICE CHECK
Battery Remaining Longevity: 160 mo
Brady Statistic AP VS Percent: 57.25 %
Brady Statistic AS VP Percent: 0.02 %
Brady Statistic AS VS Percent: 42.65 %
Date Time Interrogation Session: 20200206181619
Implantable Lead Implant Date: 20050804
Implantable Lead Location: 753860
Implantable Lead Model: 4076
Lead Channel Impedance Value: 285 Ohm
Lead Channel Pacing Threshold Amplitude: 1.25 V
Lead Channel Pacing Threshold Pulse Width: 0.4 ms
Lead Channel Sensing Intrinsic Amplitude: 1.625 mV
Lead Channel Sensing Intrinsic Amplitude: 1.625 mV
Lead Channel Sensing Intrinsic Amplitude: 6.75 mV
Lead Channel Sensing Intrinsic Amplitude: 6.75 mV
Lead Channel Setting Pacing Amplitude: 2.5 V
Lead Channel Setting Pacing Pulse Width: 0.4 ms
MDC IDC LEAD IMPLANT DT: 20050804
MDC IDC LEAD LOCATION: 753859
MDC IDC MSMT BATTERY VOLTAGE: 3.14 V
MDC IDC MSMT LEADCHNL RA IMPEDANCE VALUE: 342 Ohm
MDC IDC MSMT LEADCHNL RA PACING THRESHOLD AMPLITUDE: 0.5 V
MDC IDC MSMT LEADCHNL RA PACING THRESHOLD PULSEWIDTH: 0.4 ms
MDC IDC MSMT LEADCHNL RV IMPEDANCE VALUE: 323 Ohm
MDC IDC MSMT LEADCHNL RV IMPEDANCE VALUE: 399 Ohm
MDC IDC PG IMPLANT DT: 20190715
MDC IDC SET LEADCHNL RA PACING AMPLITUDE: 1.5 V
MDC IDC SET LEADCHNL RV SENSING SENSITIVITY: 0.9 mV
MDC IDC STAT BRADY AP VP PERCENT: 0.07 %
MDC IDC STAT BRADY RA PERCENT PACED: 57.43 %
MDC IDC STAT BRADY RV PERCENT PACED: 0.1 %

## 2018-03-11 NOTE — Progress Notes (Signed)
Remote pacemaker transmission.   

## 2018-06-05 ENCOUNTER — Encounter: Payer: Medicare Other | Admitting: *Deleted

## 2018-06-05 ENCOUNTER — Other Ambulatory Visit: Payer: Self-pay

## 2018-06-06 ENCOUNTER — Telehealth: Payer: Self-pay

## 2018-06-06 NOTE — Telephone Encounter (Signed)
Unable to speak  with patient to remind of missed remote transmission 

## 2018-06-12 ENCOUNTER — Encounter: Payer: Self-pay | Admitting: Cardiology

## 2018-06-23 DEATH — deceased
